# Patient Record
Sex: Male | Born: 1985 | Race: Black or African American | Hispanic: No | Smoking: Never smoker
Health system: Southern US, Community
[De-identification: ages and names within clinical notes are randomized; demographics above are authoritative.]

## PROBLEM LIST (undated history)

## (undated) DIAGNOSIS — R569 Unspecified convulsions: Secondary | ICD-10-CM

## (undated) DIAGNOSIS — U071 COVID-19: Secondary | ICD-10-CM

## (undated) DIAGNOSIS — G43909 Migraine, unspecified, not intractable, without status migrainosus: Secondary | ICD-10-CM

## (undated) HISTORY — PX: ABDOMINAL SURGERY: SHX537

---

## 2013-06-09 ENCOUNTER — Emergency Department (HOSPITAL_COMMUNITY)
Admission: EM | Admit: 2013-06-09 | Discharge: 2013-06-09 | Discharge status: Against Medical Advice | Payer: Medicare Other | Attending: Emergency Medicine | Admitting: Emergency Medicine

## 2013-06-09 ENCOUNTER — Encounter (HOSPITAL_COMMUNITY): Payer: Self-pay | Admitting: Emergency Medicine

## 2013-06-09 DIAGNOSIS — R5381 Other malaise: Secondary | ICD-10-CM | POA: Insufficient documentation

## 2013-06-09 DIAGNOSIS — R5383 Other fatigue: Principal | ICD-10-CM

## 2013-06-09 DIAGNOSIS — R51 Headache: Secondary | ICD-10-CM | POA: Insufficient documentation

## 2013-06-09 HISTORY — DX: Unspecified convulsions: R56.9

## 2013-06-09 NOTE — ED Notes (Addendum)
No answer unable to find

## 2013-06-09 NOTE — ED Notes (Addendum)
Per PTAR: Pt walking outside when he started to feel faint and hot. Pt denies eating or drinking anything (states I don't like water). PT also reports HA. Neuro intact. AO x4. Ambulatory to triage. NAD. 145/100. 88 reg. 20 RR. 96% RA. CBG 75.

## 2013-06-09 NOTE — ED Notes (Signed)
No answer, unable to find. 

## 2013-11-18 ENCOUNTER — Encounter (HOSPITAL_COMMUNITY): Payer: Self-pay | Admitting: Emergency Medicine

## 2013-11-18 ENCOUNTER — Emergency Department (HOSPITAL_COMMUNITY)
Admission: EM | Admit: 2013-11-18 | Discharge: 2013-11-18 | Disposition: A | Discharge status: Home / Self Care | Payer: Medicare Other | Attending: Emergency Medicine | Admitting: Emergency Medicine

## 2013-11-18 ENCOUNTER — Emergency Department (HOSPITAL_COMMUNITY): Payer: Medicare Other

## 2013-11-18 DIAGNOSIS — R51 Headache: Secondary | ICD-10-CM | POA: Diagnosis not present

## 2013-11-18 DIAGNOSIS — R519 Headache, unspecified: Secondary | ICD-10-CM

## 2013-11-18 MED ORDER — DIPHENHYDRAMINE HCL 50 MG/ML IJ SOLN
25.0000 mg | Freq: Once | INTRAMUSCULAR | Status: AC
  Administered 2013-11-18: 25 mg via INTRAVENOUS
  Filled 2013-11-18: qty 1

## 2013-11-18 MED ORDER — KETOROLAC TROMETHAMINE 30 MG/ML IJ SOLN
30.0000 mg | Freq: Once | INTRAMUSCULAR | Status: AC
  Administered 2013-11-18: 30 mg via INTRAVENOUS
  Filled 2013-11-18: qty 1

## 2013-11-18 MED ORDER — SODIUM CHLORIDE 0.9 % IV BOLUS (SEPSIS)
1000.0000 mL | Freq: Once | INTRAVENOUS | Status: AC
  Administered 2013-11-18: 1000 mL via INTRAVENOUS

## 2013-11-18 MED ORDER — METOCLOPRAMIDE HCL 5 MG/ML IJ SOLN
10.0000 mg | Freq: Once | INTRAMUSCULAR | Status: AC
  Administered 2013-11-18: 10 mg via INTRAVENOUS
  Filled 2013-11-18: qty 2

## 2013-11-18 NOTE — Discharge Instructions (Signed)
Refer to attached documents for more information. Return to the ED with worsening or concerning symptoms.  °

## 2013-11-18 NOTE — ED Notes (Signed)
Patient transported to CT 

## 2013-11-18 NOTE — ED Notes (Signed)
Pt arrived via EMS with c/o of awaken yesterday with headache and rt eye socket pain and took some Tylenol PM at 1700 and fell asleep. Then awaken this morning (0350) with same problem and little dizzinesss. Denies N/V or visual disturbances.

## 2013-11-18 NOTE — ED Provider Notes (Signed)
Medical screening examination/treatment/procedure(s) were performed by non-physician practitioner and as supervising physician I was immediately available for consultation/collaboration.   Billie Intriago, MD 11/18/13 0710 

## 2013-11-18 NOTE — ED Provider Notes (Signed)
CSN: [id number]     Arrival date & time 11/18/13  [id number] History   First MD Initiated Contact with Patient 11/18/13 0455     Chief Complaint  Patient presents with  . Headache     (Consider location/radiation/quality/duration/timing/severity/associated sxs/prior Treatment) HPI Comments: Patient is a 28 year old male with a past medical history of chronic migraines who presents with a headache for 2 days. Patient reports a gradual onset and progressive worsening of the headache. The pain is sharp, constant and is located in generalized head without radiation. Patient has tried tylenol for symptoms without relief. No alleviating/aggravating factors. Patient reports associated nausea. Patient denies fever, vomiting, diarrhea, numbness/tingling, weakness, visual changes, congestion, chest pain, SOB, abdominal pain.      Past Medical History  Diagnosis Date  . Seizures     pt last seizure appx 14 years ago   History reviewed. No pertinent past surgical history. Family History  Problem Relation Age of Onset  . Seizures Father    History  Substance Use Topics  . Smoking status: Never Smoker   . Smokeless tobacco: Not on file  . Alcohol Use: No    Review of Systems  Constitutional: Negative for fever, chills and fatigue.  HENT: Negative for trouble swallowing.   Eyes: Negative for visual disturbance.  Respiratory: Negative for shortness of breath.   Cardiovascular: Negative for chest pain and palpitations.  Gastrointestinal: Negative for nausea, vomiting, abdominal pain and diarrhea.  Genitourinary: Negative for dysuria and difficulty urinating.  Musculoskeletal: Negative for arthralgias and neck pain.  Skin: Negative for color change.  Neurological: Positive for headaches. Negative for dizziness and weakness.  Psychiatric/Behavioral: Negative for dysphoric mood.      Allergies  Review of patient's allergies indicates no known allergies.  Home Medications   Prior to  Admission medications   Medication Sig Start Date End Date Taking? Authorizing Provider  acetaminophen (TYLENOL) 500 MG tablet Take 1,000 mg by mouth every 6 (six) hours as needed for headache.   Yes Historical Provider, MD   BP 142/88  Pulse 87  Temp(Src) 99.1 F (37.3 C) (Oral)  Resp 18  Ht 5\' 7"  (1.702 m)  Wt 213 lb (96.616 kg)  BMI 33.35 kg/m2  SpO2 97% Physical Exam  Nursing note and vitals reviewed. Constitutional: He is oriented to person, place, and time. He appears well-developed and well-nourished. No distress.  HENT:  Head: Normocephalic and atraumatic.  Eyes: Conjunctivae and EOM are normal.  Neck: Normal range of motion.  Cardiovascular: Normal rate and regular rhythm.  Exam reveals no gallop and no friction rub.   No murmur heard. Pulmonary/Chest: Effort normal and breath sounds normal. He has no wheezes. He has no rales. He exhibits no tenderness.  Abdominal: Soft. He exhibits no distension. There is no tenderness. There is no rebound.  Musculoskeletal: Normal range of motion.  Neurological: He is alert and oriented to person, place, and time. No cranial nerve deficit. Coordination normal.  Speech is goal-oriented. Moves limbs without ataxia.   Skin: Skin is warm and dry.  Psychiatric: He has a normal mood and affect. His behavior is normal.    ED Course  Procedures (including critical care time) Labs Review Labs Reviewed - No data to display  Imaging Review Ct Head Wo Contrast  11/18/2013   CLINICAL DATA:  Initial evaluation for acute headache.  EXAM: CT HEAD WITHOUT CONTRAST  TECHNIQUE: Contiguous axial images were obtained from the base of the skull through the vertex without intravenous  contrast.  COMPARISON:  None.  FINDINGS: There is no acute intracranial hemorrhage or infarct. No mass lesion or midline shift. Gray-white matter differentiation is well maintained. Ventricles are normal in size without evidence of hydrocephalus. CSF containing spaces are  within normal limits. No extra-axial fluid collection.  The calvarium is intact.  Orbital soft tissues are within normal limits.  The paranasal sinuses and mastoid air cells are well pneumatized and free of fluid.  Scalp soft tissues are unremarkable.  IMPRESSION: Normal head CT with no acute intracranial abnormality identified.   Electronically Signed   By: Rise MuBenjamin  McClintock M.D.   On: 11/18/2013 06:32     EKG Interpretation None      MDM   Final diagnoses:  Headache    5:20 AM Patient will have CT head per request. Patient will have fluids, toradol, reglan, and benadryl. Vitals stable and patient afebrile. No neuro deficits noted.   6:41 AM Head CT unremarkable for acute changes. Patient reports improvement of symptoms and will be discharged. Vitals stable and patient afebrile.   Emilia BeckKaitlyn Ovella [place], [place] JerseyPA-C 11/18/13 [phone]

## 2014-08-12 ENCOUNTER — Emergency Department (HOSPITAL_COMMUNITY)
Admission: EM | Admit: 2014-08-12 | Discharge: 2014-08-12 | Disposition: A | Discharge status: Home / Self Care | Payer: Self-pay | Attending: Emergency Medicine | Admitting: Emergency Medicine

## 2014-08-12 ENCOUNTER — Encounter (HOSPITAL_COMMUNITY): Payer: Self-pay

## 2014-08-12 DIAGNOSIS — R05 Cough: Secondary | ICD-10-CM | POA: Insufficient documentation

## 2014-08-12 DIAGNOSIS — R0981 Nasal congestion: Secondary | ICD-10-CM | POA: Insufficient documentation

## 2014-08-12 DIAGNOSIS — H109 Unspecified conjunctivitis: Secondary | ICD-10-CM | POA: Insufficient documentation

## 2014-08-12 MED ORDER — POLYMYXIN B-TRIMETHOPRIM 10000-0.1 UNIT/ML-% OP SOLN: 1.0000 [drp] | OPHTHALMIC | Status: DC

## 2014-08-12 NOTE — Discharge Instructions (Signed)
Please follow up with your primary care physician in 1-2 days. If you do not have one [facility] listed above. Please follow up with Dr. Charlotte SanesMcCuen if symptoms not improving to schedule a follow up appointment. Please use antibiotic drops as prescribed. Please read all discharge instructions and return precautions.    Conjunctivitis Conjunctivitis is commonly called "pink eye." Conjunctivitis can be caused by bacterial or viral infection, allergies, or injuries. There is usually redness of the lining of the eye, itching, discomfort, and sometimes discharge. There may be deposits of matter along the eyelids. A viral infection usually causes a watery discharge, while a bacterial infection causes a yellowish, thick discharge. Pink eye is very contagious and spreads by direct contact. You may be given antibiotic eyedrops as part of your treatment. Before using your eye medicine, remove all drainage from the eye by washing gently with warm water and cotton balls. Continue to use the medication until you have awakened 2 mornings in a row without discharge from the eye. Do not rub your eye. This increases the irritation and helps spread infection. Use separate towels from other household members. Wash your hands with soap and water before and after touching your eyes. Use cold compresses to reduce pain and sunglasses to relieve irritation from light. Do not wear contact lenses or wear eye makeup until the infection is gone. SEEK MEDICAL CARE IF:   Your symptoms are not better after 3 days of treatment.  You have increased pain or trouble seeing.  The outer eyelids become very red or swollen. Document Released: 02/18/2004 Document Revised: 04/04/2011 Document Reviewed: 01/10/2005 [facility] Patient Information 2015 [place] [place]. This information is not intended to replace advice given to you by your health care provider. Make sure you discuss any questions you have with  your health care provider.

## 2014-08-12 NOTE — ED Notes (Signed)
Pt states his eye started itching and draining yesterday

## 2014-08-12 NOTE — ED Provider Notes (Signed)
CSN: [id number]     Arrival date & time 08/12/14  0006 History   First MD Initiated Contact with Patient 08/12/14 0032     Chief Complaint  Patient presents with  . Eye Pain     (Consider location/radiation/quality/duration/timing/severity/associated sxs/prior Treatment) HPI Comments: Patient is a 29 year old male presented to the emergency department for eye redness, itching and drainage. He states the symptoms started yesterday. He endorses it has been in both eyes. He states he's had to scrub open his eyes after sleeping.he states he has had a few days of upper respiratory infection symptoms. He is concerned this may be an allergic reaction to his cat ages Lorin Picket, denies any scratches from the cat. Denies any fevers, visual disturbances, blurred vision, photophobia, fever. 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   Past Medical History  Diagnosis Date  . Seizures     pt last seizure appx 14 years ago   History reviewed. No pertinent past surgical history. Family History  Problem Relation Age of Onset  . Seizures Father    History  Substance Use Topics  . Smoking status: Never Smoker   . Smokeless tobacco: Not on file  . Alcohol Use: No    Review of Systems  HENT: Positive for congestion and rhinorrhea.   Eyes: Positive for discharge, redness and itching. Negative for photophobia, pain and visual disturbance.  Respiratory: Positive for cough.   All other systems reviewed and are negative.     Allergies  Review of patient's allergies indicates no known allergies.  Home Medications   Prior to Admission medications   Medication Sig Start Date End Date Taking? Authorizing Provider  acetaminophen (TYLENOL) 500 MG tablet Take 1,000 mg by mouth every 6 (six) hours as needed for headache.    Historical Provider, MD  trimethoprim-polymyxin b (POLYTRIM) ophthalmic solution Place 1 drop into both eyes every 4 (four) hours. While awake x 5 days 08/12/14   Victorino Dike Cordarryl Monrreal, PA-C    BP 143/91 mmHg  Pulse 71  Temp(Src) 97.9 F (36.6 C) (Oral)  Resp 20  SpO2 95% Physical Exam  Constitutional: He is oriented to person, place, and time. He appears well-developed and well-nourished. No distress.  HENT:  Head: Normocephalic and atraumatic.  Right Ear: External ear normal.  Left Ear: External ear normal.  Nose: Nose normal.  Mouth/Throat: Oropharynx is clear and moist.  Eyes: Right eye exhibits discharge. Left eye exhibits discharge.  Visual Acuity - Bilateral Near: 20 ; Bilateral Distance: 20 ; R Near: 20 ; R Distance: 25 ; L Near: 20 ; L Distance: 20  No periorbital or orbital swelling, edema, erythema or tenderness. Dry purulent drainage noted from both eyes. Conjunctival erythema noted. Pupils are equal round reactive to light. Extraocular motions are intact.  Neck: Normal range of motion. Neck supple.  No nuchal rigidity.   Cardiovascular: Normal rate, regular rhythm and normal heart sounds.   Pulmonary/Chest: Effort normal and breath sounds normal.  Abdominal: Soft.  Musculoskeletal: Normal range of motion.  Neurological: He is alert and oriented to person, place, and time.  Skin: Skin is warm and dry. He is not diaphoretic.  Psychiatric: He has a normal mood and affect.  Nursing note and vitals reviewed.   ED Course  Procedures (including critical care time) Medications - No data to display  Labs Review Labs Reviewed - No data to display  Imaging Review No results found.   EKG Interpretation None      MDM   Final diagnoses:  Bilateral conjunctivitis    Filed Vitals:   08/12/14 0031  BP: 143/91  Pulse: 71  Temp: 97.9 F (36.6 C)  Resp: 20   Afebrile, NAD, non-toxic appearing, AAOx4.   Patient presentation consistent with bacterial conjunctivitis. Conjunctival injection with purulent discharge. No entrapment, consensual photophobia.  Presentation non-concerning for iritis, corneal abrasions, or HSV.  Will prescribe Polytrim drops.  Patient is not a contact lens wearer.  Personal hygiene and frequent handwashing discussed.  Patient advised to followup with PCP if symptoms persist or worsen in any way including vision change or purulent discharge.  Patient/parent/guardian verbalizes understanding and is agreeable with discharge.     Francee PiccoloJennifer Bernabe Dorce, PA-C 08/12/14 0208  Francee PiccoloJennifer Glenda Spelman, PA-C 08/12/14 [id number]  Marisa Severinlga Otter, MD 08/12/14 [phone]

## 2014-10-13 ENCOUNTER — Encounter (HOSPITAL_COMMUNITY): Payer: Self-pay | Admitting: Emergency Medicine

## 2014-10-13 ENCOUNTER — Emergency Department (HOSPITAL_COMMUNITY)
Admission: EM | Admit: 2014-10-13 | Discharge: 2014-10-13 | Disposition: A | Discharge status: Home / Self Care | Payer: Self-pay | Attending: Emergency Medicine | Admitting: Emergency Medicine

## 2014-10-13 ENCOUNTER — Emergency Department (HOSPITAL_COMMUNITY): Payer: Self-pay

## 2014-10-13 DIAGNOSIS — J069 Acute upper respiratory infection, unspecified: Secondary | ICD-10-CM | POA: Insufficient documentation

## 2014-10-13 MED ORDER — CETIRIZINE-PSEUDOEPHEDRINE ER 5-120 MG PO TB12: 1.0000 | ORAL_TABLET | Freq: Two times a day (BID) | ORAL | Status: DC

## 2014-10-13 MED ORDER — DEXTROMETHORPHAN POLISTIREX ER 30 MG/5ML PO SUER: 30.0000 mg | ORAL | Status: DC | PRN

## 2014-10-13 NOTE — Discharge Instructions (Signed)
Take zyrtec D for congestion. Take delsym for cough. Refer to attached documents for more information.

## 2014-10-13 NOTE — ED Notes (Signed)
Pt from home c/o cough,headache, and congestion x 9 days.

## 2014-10-13 NOTE — ED Provider Notes (Signed)
CSN: [id number]     Arrival date & time 10/13/14  0203 History   First MD Initiated Contact with Patient 10/13/14 0248     Chief Complaint  Patient presents with  . URI     (Consider location/radiation/quality/duration/timing/severity/associated sxs/prior Treatment) Patient is a 29 y.o. male presenting with URI. The history is provided by the patient. No language interpreter was used.  URI Presenting symptoms: congestion and cough   Presenting symptoms: no fatigue and no fever   Severity:  Moderate Onset quality:  Gradual Duration:  1 week Timing:  Constant Progression:  Unchanged Chronicity:  New Relieved by:  Nothing Worsened by:  Nothing tried Ineffective treatments:  None tried Associated symptoms: no arthralgias and no neck pain   Risk factors: not elderly, no chronic cardiac disease, no chronic respiratory disease, no diabetes mellitus, no immunosuppression, no recent illness, no recent travel and no sick contacts     Past Medical History  Diagnosis Date  . Seizures     pt last seizure appx 14 years ago   History reviewed. No pertinent past surgical history. Family History  Problem Relation Age of Onset  . Seizures Father    Social History  Substance Use Topics  . Smoking status: Never Smoker   . Smokeless tobacco: None  . Alcohol Use: No    Review of Systems  Constitutional: Negative for fever, chills and fatigue.  HENT: Positive for congestion. Negative for trouble swallowing.   Eyes: Negative for visual disturbance.  Respiratory: Positive for cough. Negative for shortness of breath.   Cardiovascular: Negative for chest pain and palpitations.  Gastrointestinal: Negative for nausea, vomiting, abdominal pain and diarrhea.  Genitourinary: Negative for dysuria and difficulty urinating.  Musculoskeletal: Negative for arthralgias and neck pain.  Skin: Negative for color change.  Neurological: Negative for dizziness and weakness.  Psychiatric/Behavioral:  Negative for dysphoric mood.  All other systems reviewed and are negative.     Allergies  Review of patient's allergies indicates no known allergies.  Home Medications   Prior to Admission medications   Medication Sig Start Date End Date Taking? Authorizing Provider  acetaminophen (TYLENOL) 500 MG tablet Take 1,000 mg by mouth every 6 (six) hours as needed for headache.    Historical Provider, MD  trimethoprim-polymyxin b (POLYTRIM) ophthalmic solution Place 1 drop into both eyes every 4 (four) hours. While awake x 5 days Patient not taking: Reported on 10/13/2014 08/12/14   Victorino Dike Piepenbrink, PA-C   BP 142/99 mmHg  Pulse 66  Temp(Src) 98 F (36.7 C) (Oral)  Resp 20  SpO2 97% Physical Exam  Constitutional: He is oriented to person, place, and time. He appears well-developed and well-nourished. No distress.  HENT:  Head: Normocephalic and atraumatic.  Eyes: Conjunctivae are normal.  Neck: Normal range of motion. Neck supple.  Cardiovascular: Normal rate and regular rhythm.  Exam reveals no gallop and no friction rub.   No murmur heard. Pulmonary/Chest: Effort normal and breath sounds normal. He has no wheezes. He has no rales. He exhibits no tenderness.  Abdominal: Soft. He exhibits no distension. There is no tenderness. There is no rebound.  Musculoskeletal: Normal range of motion.  Neurological: He is alert and oriented to person, place, and time. Coordination normal.  Speech is goal-oriented. Moves limbs without ataxia.   Skin: Skin is warm and dry.  Psychiatric: He has a normal mood and affect. His behavior is normal.  Nursing note and vitals reviewed.   ED Course  Procedures (including critical care  time) Labs Review Labs Reviewed - No data to display  Imaging Review Dg Chest 2 View  10/13/2014   CLINICAL DATA:  Cough, headache and congestion for 9 days  EXAM: CHEST  2 VIEW  COMPARISON:  None  FINDINGS: Normal heart size, mediastinal contours, and pulmonary  vascularity.  Minimal peribronchial thickening.  No pulmonary infiltrate, pleural effusion, or pneumothorax.  Bones unremarkable.  IMPRESSION: Minimal bronchitic changes without infiltrate.   Electronically Signed   By: Ulyses Southward M.D.   On: 10/13/2014 03:03   I have personally reviewed and evaluated these images and lab results as part of my medical decision-making.   EKG Interpretation None      MDM   Final diagnoses:  URI (upper respiratory infection)    3:58 AM Chest xray shows mild bronchitic changes. Vitals stable and patient afebrile. Patient has a viral illness and will be treated symptomatically.    Emilia Beck, PA-C 10/13/14 0450  Paula Libra, MD 10/13/14 [phone]

## 2015-05-19 ENCOUNTER — Encounter (HOSPITAL_COMMUNITY): Payer: Self-pay | Admitting: *Deleted

## 2015-05-19 ENCOUNTER — Emergency Department (HOSPITAL_COMMUNITY)
Admission: EM | Admit: 2015-05-19 | Discharge: 2015-05-19 | Disposition: A | Discharge status: Home / Self Care | Payer: Medicare Other | Attending: Emergency Medicine | Admitting: Emergency Medicine

## 2015-05-19 DIAGNOSIS — R51 Headache: Secondary | ICD-10-CM | POA: Diagnosis present

## 2015-05-19 DIAGNOSIS — G43809 Other migraine, not intractable, without status migrainosus: Secondary | ICD-10-CM

## 2015-05-19 HISTORY — DX: Migraine, unspecified, not intractable, without status migrainosus: G43.909

## 2015-05-19 MED ORDER — SODIUM CHLORIDE 0.9 % IV BOLUS (SEPSIS)
1000.0000 mL | Freq: Once | INTRAVENOUS | Status: AC
  Administered 2015-05-19: 1000 mL via INTRAVENOUS

## 2015-05-19 MED ORDER — DIPHENHYDRAMINE HCL 50 MG/ML IJ SOLN
25.0000 mg | Freq: Once | INTRAMUSCULAR | Status: AC
  Administered 2015-05-19: 25 mg via INTRAVENOUS
  Filled 2015-05-19: qty 1

## 2015-05-19 MED ORDER — DEXAMETHASONE SODIUM PHOSPHATE 10 MG/ML IJ SOLN
10.0000 mg | Freq: Once | INTRAMUSCULAR | Status: AC
  Administered 2015-05-19: 10 mg via INTRAVENOUS
  Filled 2015-05-19: qty 1

## 2015-05-19 MED ORDER — PROCHLORPERAZINE EDISYLATE 5 MG/ML IJ SOLN
10.0000 mg | Freq: Once | INTRAMUSCULAR | Status: AC
  Administered 2015-05-19: 10 mg via INTRAVENOUS
  Filled 2015-05-19: qty 2

## 2015-05-19 NOTE — ED Notes (Signed)
Pt here with c/o headache x 1 hour.

## 2015-05-19 NOTE — ED Notes (Signed)
Pt here with c/o headache x 1 hour.  Pt states he did not take anything for it.  Denies any other complications.

## 2015-05-19 NOTE — ED Provider Notes (Signed)
CSN: [id number]     Arrival date & time 05/19/15  [id number] History   First MD Initiated Contact with Patient 05/19/15 [phone]     Chief Complaint  Patient presents with  . Headache     (Consider location/radiation/quality/duration/timing/severity/associated sxs/prior Treatment) HPI Comments: Migraines all life This headache started around 8AM Feels like someone "hitting me in the head with a hammer" Similar to past migraines 5 [place] but didn't help, not sure what it was, did not continue using it because it didn't help Did not try anything  Bright lights, loud sounds make it worse Watching TV No n/v  Patient is a 30 y.o. male presenting with headaches.  Headache Associated symptoms: no abdominal pain, no back pain, no fever, no neck stiffness, no numbness, no sore throat and no weakness     Past Medical History  Diagnosis Date  . Seizures (HCC)     pt last seizure appx 14 years ago  . Migraine    History reviewed. No pertinent past surgical history. Family History  Problem Relation Age of Onset  . Seizures Father    Social History  Substance Use Topics  . Smoking status: Never Smoker   . Smokeless tobacco: None  . Alcohol Use: No    Review of Systems  Constitutional: Negative for fever.  HENT: Negative for sore throat.   Eyes: Positive for visual disturbance (blurred vision similar to past migraines).  Respiratory: Negative for shortness of breath.   Cardiovascular: Negative for chest pain.  Gastrointestinal: Negative for abdominal pain.  Genitourinary: Negative for difficulty urinating.  Musculoskeletal: Negative for back pain and neck stiffness.  Skin: Negative for rash.  Neurological: Positive for headaches. Negative for syncope, facial asymmetry, weakness and numbness.      Allergies  Review of patient's allergies indicates no known allergies.  Home Medications   Prior to Admission medications   Medication Sig Start Date End Date  Taking? Authorizing Provider  acetaminophen (TYLENOL) 500 MG tablet Take 1,000 mg by mouth every 6 (six) hours as needed for headache.   Yes Historical Provider, MD  cetirizine (ZYRTEC) 10 MG tablet Take 10 mg by mouth daily as needed for allergies.   Yes Historical Provider, MD  Phenyleph-CPM-DM-APAP (TYLENOL COLD HEAD CONGESTION PO) Take 1 tablet by mouth daily as needed (headache).   Yes Historical Provider, MD   BP 147/106 mmHg  Pulse 70  Temp(Src) 97.6 F (36.4 C) (Oral)  Resp 20  SpO2 95% Physical Exam  Constitutional: He is oriented to person, place, and time. He appears well-developed and well-nourished. No distress.  HENT:  Head: Normocephalic and atraumatic.  Eyes: Conjunctivae and EOM are normal.  Neck: Normal range of motion.  Cardiovascular: Normal rate, regular rhythm, normal heart sounds and intact distal pulses.  Exam reveals no gallop and no friction rub.   No murmur heard. Pulmonary/Chest: Effort normal and breath sounds normal. No respiratory distress. He has no wheezes. He has no rales.  Abdominal: Soft. He exhibits no distension. There is no tenderness. There is no guarding.  Musculoskeletal: He exhibits no edema.  Neurological: He is alert and oriented to person, place, and time. He has normal strength. No cranial nerve deficit or sensory deficit. He displays a negative Romberg sign. Coordination and gait normal. GCS eye subscore is 4. GCS verbal subscore is 5. GCS motor subscore is 6.  Skin: Skin is warm and dry. He is not diaphoretic.  Nursing note and vitals reviewed.   ED  Course  Procedures (including critical care time) Labs Review Labs Reviewed - No data to display  Imaging Review No results found. I have personally reviewed and evaluated these images and lab results as part of my medical decision-making.   EKG Interpretation None      MDM   Final diagnoses:  Other migraine without status migrainosus, not intractable   30yo male with history  of migraines presents with concern of headache.  Headache began slowly, no trauma, no fevers, and normal neurologic exam and have low suspicion for [facility], SDH or meningitis.  Patient was given compazine, decadron and benadryl with improvement in headache.  Patient discharged in stable condition with understanding of reasons to return.  Pt requesting MRI and reports that Neurologist in [place] had discussed doing a repeat (pt with headaches for most of his life and prior imaging.  Given information for PCP follow up and Neurology follow up for migraines.   Alvira Monday, MD 05/19/15 2155

## 2015-05-19 NOTE — Discharge Instructions (Signed)

## 2015-05-26 ENCOUNTER — Ambulatory Visit (INDEPENDENT_AMBULATORY_CARE_PROVIDER_SITE_OTHER): Payer: Medicare Other | Admitting: Family Medicine

## 2015-05-26 ENCOUNTER — Encounter: Payer: Self-pay | Admitting: Family Medicine

## 2015-05-26 VITALS — BP 121/77 | HR 60 | Temp 98.1°F | Resp 18 | Ht 64.0 in | Wt 231.0 lb

## 2015-05-26 DIAGNOSIS — Z23 Encounter for immunization: Secondary | ICD-10-CM | POA: Diagnosis not present

## 2015-05-26 DIAGNOSIS — E669 Obesity, unspecified: Secondary | ICD-10-CM | POA: Diagnosis not present

## 2015-05-26 DIAGNOSIS — IMO0001 Reserved for inherently not codable concepts without codable children: Secondary | ICD-10-CM

## 2015-05-26 DIAGNOSIS — Z114 Encounter for screening for human immunodeficiency virus [HIV]: Secondary | ICD-10-CM | POA: Diagnosis not present

## 2015-05-26 DIAGNOSIS — R030 Elevated blood-pressure reading, without diagnosis of hypertension: Secondary | ICD-10-CM | POA: Diagnosis not present

## 2015-05-26 LAB — COMPLETE METABOLIC PANEL WITH GFR
ALT: 123 U/L — ABNORMAL HIGH (ref 9–46)
AST: 71 U/L — ABNORMAL HIGH (ref 10–40)
Albumin: 4.1 g/dL (ref 3.6–5.1)
Alkaline Phosphatase: 92 U/L (ref 40–115)
BILIRUBIN TOTAL: 0.3 mg/dL (ref 0.2–1.2)
BUN: 13 mg/dL (ref 7–25)
CO2: 23 mmol/L (ref 20–31)
Calcium: 9.2 mg/dL (ref 8.6–10.3)
Chloride: 107 mmol/L (ref 98–110)
Creat: 0.83 mg/dL (ref 0.60–1.35)
GFR, Est African American: >89 mL/min (ref >=60)
GLUCOSE: 111 mg/dL — AB (ref 65–99)
Potassium: 4.1 mmol/L (ref 3.5–5.3)
SODIUM: 138 mmol/L (ref 135–146)
TOTAL PROTEIN: 6.8 g/dL (ref 6.1–8.1)

## 2015-05-26 LAB — CBC WITH DIFFERENTIAL/PLATELET
BASOS ABS: 0 {cells}/uL (ref 0–200)
Basophils Relative: 0 %
Eosinophils Absolute: 154 cells/uL (ref 15–500)
Eosinophils Relative: 2 %
HCT: 40.6 % (ref 38.5–50.0)
HEMOGLOBIN: 12.9 g/dL — AB (ref 13.2–17.1)
LYMPHS ABS: 2079 {cells}/uL (ref 850–3900)
Lymphocytes Relative: 27 %
MCH: 25.6 pg — ABNORMAL LOW (ref 27.0–33.0)
MCHC: 31.8 g/dL — ABNORMAL LOW (ref 32.0–36.0)
MCV: 80.6 fL (ref 80.0–100.0)
MONO ABS: 693 {cells}/uL (ref 200–950)
MONOS PCT: 9 %
MPV: 10.7 fL (ref 7.5–12.5)
NEUTROS ABS: 4774 {cells}/uL (ref 1500–7800)
Neutrophils Relative %: 62 %
Platelets: 250 10*3/uL (ref 140–400)
RBC: 5.04 MIL/uL (ref 4.20–5.80)
RDW: 15.2 % — ABNORMAL HIGH (ref 11.0–15.0)
WBC: 7.7 10*3/uL (ref 3.8–10.8)

## 2015-05-26 LAB — LIPID PANEL
CHOL/HDL RATIO: 2.4 ratio (ref <=5.0)
Cholesterol: 105 mg/dL — ABNORMAL LOW (ref 125–200)
HDL: 44 mg/dL (ref >=40)
LDL Cholesterol: 47 mg/dL (ref <130)
Triglycerides: 71 mg/dL (ref <150)
VLDL: 14 mg/dL (ref <30)

## 2015-05-26 LAB — TSH: TSH: 1.53 m[IU]/L (ref 0.40–4.50)

## 2015-05-26 NOTE — Patient Instructions (Signed)

## 2015-05-26 NOTE — Progress Notes (Signed)
Patient is here to establish care  Patient denies pain at this time.   

## 2015-05-26 NOTE — Progress Notes (Signed)
Patient ID: David Stevenson, male   DOB: 02/18/1985, 30 y.o.   MRN: [id number]   David BeringChristopher Stevenson, is a 30 y.o. male  [id number]  [id number]  DOB - 07/31/1985  CC:  Chief Complaint  Patient presents with  . Establish Care       HPI: David Stevenson is a 30 y.o. male here to establish care. He reports being generally healthly. He has a history of migraine headaches but has recently had an acupuncture procedure which he reports has helped significantly. He only other concern is of a 40 pound weight gain. Over last year or two. He is on not chronic medications.   Health Maintenance:  He denies tobacco, alcohol, or drug use. His Tdap is up to date. He has never been screened for HIV.  No Known Allergies Past Medical History  Diagnosis Date  . Seizures (HCC)     pt last seizure appx 14 years ago  . Migraine    Current Outpatient Prescriptions on File Prior to Visit  Medication Sig Dispense Refill  . acetaminophen (TYLENOL) 500 MG tablet Take 1,000 mg by mouth every 6 (six) hours as needed for headache.    . Phenyleph-CPM-DM-APAP (TYLENOL COLD HEAD CONGESTION PO) Take 1 tablet by mouth daily as needed (headache).    . cetirizine (ZYRTEC) 10 MG tablet Take 10 mg by mouth daily as needed for allergies. Reported on 05/26/2015     No current facility-administered medications on file prior to visit.   Family History  Problem Relation Age of Onset  . Seizures Father    Social History   Social History  . Marital Status: Single    Spouse Name: N/A  . Number of Children: N/A  . Years of Education: N/A   Occupational History  . Not on file.   Social History Main Topics  . Smoking status: Never Smoker   . Smokeless tobacco: Not on file  . Alcohol Use: No  . Drug Use: No  . Sexual Activity: Yes   Other Topics Concern  . Not on file   Social History Narrative    Review of Systems: Constitutional: Negative for fever, chills, appetite change, weight loss,   Fatigue.Positive for weight gain Skin: Negative for rashes or lesions of concern. HENT: Negative for ear pain, ear discharge.nose bleeds Eyes: Negative for pain, discharge, redness, itching and visual disturbance. Neck: Negative for pain, stiffness Respiratory: Negative for cough. Positive for shortness of breath with walking since weight gain.   Cardiovascular: Negative for chest pain, palpitations and leg swelling. Gastrointestinal: Negative for abdominal pain, nausea, vomiting, diarrhea, constipations Genitourinary: Negative for dysuria, urgency, frequency, hematuria,  Musculoskeletal: Negative for back pain, joint pain, joint  swelling, and gait problem.Negative for weakness. Neurological: Negative for dizziness, tremors, fainting. Has a history of a few seizures as a child. Positive for headaches as discussed in HPI Psychiatric/Behavioral: Negative for depression, anxiety, decreased concentration, confusion   Objective:   Filed Vitals:   05/26/15 0950  BP: 121/77  Pulse: 60  Temp: 98.1 F (36.7 C)  Resp: 18    Physical Exam: Constitutional: Patient appears well-developed and well-nourished. No distress.Obese HENT: Normocephalic, atraumatic, External right and left ear normal. Oropharynx is clear and moist.  Eyes: Conjunctivae and EOM are normal. PERRLA, no scleral icterus. Neck: Normal ROM. Neck supple. No lymphadenopathy, No thyromegaly. CVS: RRR, S1/S2 +, no murmurs, no gallops, no rubs Pulmonary: Effort and breath sounds normal, no stridor, rhonchi, wheezes, rales.  Abdominal: Soft. Normoactive BS,, no distension, tenderness,  rebound or guarding.  Musculoskeletal: Normal range of motion. No edema and no tenderness.  Neuro: Alert.Normal muscle tone coordination. Non-focal Skin: Skin is warm and dry. No rash noted. Not diaphoretic. No erythema. No pallor. Psychiatric: Normal mood and affect. Behavior, judgment, thought content normal.  No results found for: WBC, HGB, HCT,  MCV, PLT No results found for: CREATININE, BUN, NA, K, CL, CO2  No results found for: HGBA1C Lipid Panel  No results found for: CHOL, TRIG, HDL, CHOLHDL, VLDL, LDLCALC     Assessment and plan:   1. Obesity - Lipid panel - TSH - Printed information on wt loss with calorie control and exercise provided.  2. Elevated blood pressure - COMPLETE METABOLIC PANEL WITH GFR - CBC with Differential  3. Need for Tdap vaccination - Tdap vaccine greater than or equal to 7yo IM  4. Screening for HIV (human immunodeficiency virus) - HIV antibody (with reflex)   Return in about 4 weeks (around 06/23/2015). For BP check by nurse.  The patient was given clear instructions to go to ER or return to medical center if symptoms don't improve, worsen or new problems develop. The patient verbalized understanding.    Henrietta Hoover FNP  05/26/2015, 12:23 PM

## 2015-05-27 LAB — HIV ANTIBODY (ROUTINE TESTING W REFLEX): HIV: NONREACTIVE

## 2015-06-23 ENCOUNTER — Ambulatory Visit: Payer: Medicare Other

## 2015-10-27 ENCOUNTER — Emergency Department (HOSPITAL_COMMUNITY)
Admission: EM | Admit: 2015-10-27 | Discharge: 2015-10-27 | Disposition: A | Discharge status: Home / Self Care | Payer: Medicaid Other | Attending: Emergency Medicine | Admitting: Emergency Medicine

## 2015-10-27 ENCOUNTER — Encounter (HOSPITAL_COMMUNITY): Payer: Self-pay | Admitting: Emergency Medicine

## 2015-10-27 ENCOUNTER — Emergency Department (HOSPITAL_COMMUNITY): Payer: Medicaid Other

## 2015-10-27 DIAGNOSIS — J069 Acute upper respiratory infection, unspecified: Secondary | ICD-10-CM

## 2015-10-27 DIAGNOSIS — R05 Cough: Secondary | ICD-10-CM

## 2015-10-27 DIAGNOSIS — R059 Cough, unspecified: Secondary | ICD-10-CM

## 2015-10-27 DIAGNOSIS — Z79899 Other long term (current) drug therapy: Secondary | ICD-10-CM | POA: Diagnosis not present

## 2015-10-27 MED ORDER — GUAIFENESIN 100 MG/5ML PO SOLN
10.0000 mL | Freq: Once | ORAL | Status: AC
  Administered 2015-10-27: 200 mg via ORAL
  Filled 2015-10-27: qty 10

## 2015-10-27 MED ORDER — IBUPROFEN 200 MG PO TABS
400.0000 mg | ORAL_TABLET | Freq: Once | ORAL | Status: AC
  Administered 2015-10-27: 400 mg via ORAL
  Filled 2015-10-27: qty 2

## 2015-10-27 NOTE — ED Notes (Signed)
Bed: WA08 Expected date:  Expected time:  Means of arrival:  Comments: EMS-LUQ pain, Flu symptoms x week

## 2015-10-27 NOTE — ED Provider Notes (Signed)
[facility]-EMERGENCY DEPT Provider Note   CSN: [id number] Arrival date & time: 10/27/15  0831     History   Chief Complaint Chief Complaint  Patient presents with  . Abdominal Pain    HPI Laurena BeringChristopher Mcclory is a 30 y.o. male.  Patient c/o non prod cough for past 1 week. Episodic, persistent. Mild sob. No sore throat. +nasal congestion. No known ill contacts. ?subjective fever. No chest pain, except when sneezes hard, and then states sharp, brief pain. No leg pain or swelling. Normal appetite. No abd pain. No nv. No headache. No neck pain or stiffness.    The history is provided by the patient.  Abdominal Pain   Pertinent negatives include vomiting and headaches.    Past Medical History:  Diagnosis Date  . Migraine   . Seizures (HCC)    pt last seizure appx 14 years ago    There are no active problems to display for this patient.   History reviewed. No pertinent surgical history.     Home Medications    Prior to Admission medications   Medication Sig Start Date End Date Taking? Authorizing Provider  acetaminophen (TYLENOL) 500 MG tablet Take 1,000 mg by mouth every 6 (six) hours as needed for headache.    Historical Provider, MD  cetirizine (ZYRTEC) 10 MG tablet Take 10 mg by mouth daily as needed for allergies. Reported on 05/26/2015    Historical Provider, MD  Phenyleph-CPM-DM-APAP (TYLENOL COLD HEAD CONGESTION PO) Take 1 tablet by mouth daily as needed (headache).    Historical Provider, MD    Family History Family History  Problem Relation Age of Onset  . Seizures Father     Social History Social History  Substance Use Topics  . Smoking status: Never Smoker  . Smokeless tobacco: Not on file  . Alcohol use No     Allergies   Review of patient's allergies indicates no known allergies.   Review of Systems Review of Systems  Constitutional: Negative for chills.  HENT: Positive for congestion. Negative for sore throat.   Eyes: Negative for redness.    Respiratory: Positive for cough and shortness of breath.   Cardiovascular: Negative for chest pain and leg swelling.  Gastrointestinal: Positive for abdominal pain. Negative for vomiting.  Musculoskeletal: Negative for neck pain and neck stiffness.  Skin: Negative for rash.  Neurological: Negative for headaches.     Physical Exam Updated Vital Signs There were no vitals taken for this visit.  Physical Exam  Constitutional: He is oriented to person, place, and time. He appears well-developed and well-nourished. No distress.  HENT:  Head: Atraumatic.  Mouth/Throat: Oropharynx is clear and moist.  Eyes: Conjunctivae are normal.  Neck: Neck supple. No tracheal deviation present.  Cardiovascular: Normal rate, regular rhythm, normal heart sounds and intact distal pulses.  Exam reveals no gallop and no friction rub.   No murmur heard. Pulmonary/Chest: Effort normal. No accessory muscle usage. No respiratory distress.  Upper resp congestion  Abdominal: Soft. He exhibits no distension. There is no tenderness.  Musculoskeletal: He exhibits no edema or tenderness.  Neurological: He is alert and oriented to person, place, and time.  Skin: Skin is warm and dry.  Psychiatric: He has a normal mood and affect.  Nursing note and vitals reviewed.    ED Treatments / Results  Labs (all labs ordered are listed, but only abnormal results are displayed) Labs Reviewed - No data to display  EKG  EKG Interpretation None  Radiology Dg Chest 2 View  Result Date: 10/27/2015 CLINICAL DATA:  Cough and chest congestion. Shortness of breath, 1 week duration. EXAM: CHEST  2 VIEW COMPARISON:  10/12/2014 FINDINGS: Heart size is normal. Mediastinal shadows are normal. The lungs are clear. No bronchial thickening. No infiltrate, mass, effusion or collapse. Pulmonary vascularity is normal. No bony abnormality. IMPRESSION: Normal chest Electronically Signed   By: Paulina Fusi M.D.   On: 10/27/2015  09:16    Procedures Procedures (including critical care time)  Medications Ordered in ED Medications - No data to display   Initial Impression / Assessment and Plan / ED Course  I have reviewed the triage vital signs and the nursing notes.  Pertinent labs & imaging results that were available during my care of the patient were reviewed by me and considered in my medical decision making (see chart for details).  Clinical Course    Cxr.   Robitussin. Motrin po.  cxr neg acute, patient appears stable for d/c.   Reviewed nursing notes and prior charts for additional history. .  Discussed xray w pt.    No increased wob.    Patient appears stable for d/c.    Final Clinical Impressions(s) / ED Diagnoses   Final diagnoses:  None    New Prescriptions New Prescriptions   No medications on file     Cathren Laine, MD 10/28/15 1437

## 2015-10-27 NOTE — Discharge Instructions (Signed)
It was our pleasure to provide your ER care today - we hope that you feel better.  Rest. Drink plenty of fluids.  Take mucinex or robitussin as need for cough and congestion.  Take motrin or aleve as need for pain.   Follow up with primary care doctor in 1 week if symptoms fail to improve/resolve.  Also have your blood pressure rechecked by primary care doctor as it is high today.   Return to ER if worse, trouble breathing, other concern.
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
## 2017-03-21 ENCOUNTER — Emergency Department (HOSPITAL_COMMUNITY)
Admission: EM | Admit: 2017-03-21 | Discharge: 2017-03-21 | Disposition: A | Discharge status: Home / Self Care | Payer: Medicare Other | Attending: Emergency Medicine | Admitting: Emergency Medicine

## 2017-03-21 ENCOUNTER — Encounter (HOSPITAL_COMMUNITY): Payer: Self-pay | Admitting: Emergency Medicine

## 2017-03-21 DIAGNOSIS — M62830 Muscle spasm of back: Secondary | ICD-10-CM | POA: Diagnosis not present

## 2017-03-21 DIAGNOSIS — R030 Elevated blood-pressure reading, without diagnosis of hypertension: Secondary | ICD-10-CM

## 2017-03-21 DIAGNOSIS — M545 Low back pain: Secondary | ICD-10-CM | POA: Diagnosis present

## 2017-03-21 MED ORDER — PREDNISONE 20 MG PO TABS
60.0000 mg | ORAL_TABLET | Freq: Once | ORAL | Status: AC
  Administered 2017-03-21: 60 mg via ORAL
  Filled 2017-03-21: qty 3

## 2017-03-21 MED ORDER — CYCLOBENZAPRINE HCL 10 MG PO TABS: 10.0000 mg | ORAL_TABLET | Freq: Two times a day (BID) | ORAL | 0 refills | Status: DC | PRN

## 2017-03-21 MED ORDER — PREDNISONE 10 MG PO TABS
20.0000 mg | ORAL_TABLET | Freq: Every day | ORAL | 0 refills | Status: DC
Start: 2017-03-21 — End: 2019-10-12

## 2017-03-21 MED ORDER — CYCLOBENZAPRINE HCL 10 MG PO TABS
10.0000 mg | ORAL_TABLET | Freq: Once | ORAL | Status: AC
  Administered 2017-03-21: 10 mg via ORAL
  Filled 2017-03-21: qty 1

## 2017-03-21 NOTE — ED Provider Notes (Signed)
[facility]-EMERGENCY DEPT Provider Note   CSN: [id number] Arrival date & time: 03/21/17  1356     History   Chief Complaint Chief Complaint  Patient presents with  . Back Pain    HPI David Stevenson is a 32 y.o. male who presents to the ED with right side back pain. Patient reports that he was sitting and watching TV and turned. When he turned he felt pain in his right lower back. Patient denies loss of control of bladder or bowels. Patient denies UTI symptoms.   HPI  Past Medical History:  Diagnosis Date  . Migraine   . Seizures (HCC)    pt last seizure appx 14 years ago    There are no active problems to display for this patient.   Past Surgical History:  Procedure Laterality Date  . ABDOMINAL SURGERY         Home Medications    Prior to Admission medications   Medication Sig Start Date End Date Taking? Authorizing Provider  acetaminophen (TYLENOL) 500 MG tablet Take 1,000 mg by mouth every 6 (six) hours as needed for headache.    [provider]  cetirizine (ZYRTEC) 10 MG tablet Take 10 mg by mouth daily as needed for allergies. Reported on 05/26/2015    [provider]  cyclobenzaprine (FLEXERIL) 10 MG tablet Take 1 tablet (10 mg total) by mouth 2 (two) times daily as needed for muscle spasms. 03/21/17   Janne Napoleon, NP  Phenyleph-CPM-DM-APAP (TYLENOL COLD HEAD CONGESTION PO) Take 1 tablet by mouth daily as needed (headache).    [provider]  predniSONE (DELTASONE) 10 MG tablet Take 2 tablets (20 mg total) by mouth daily. 03/21/17   Janne Napoleon, NP    Family History Family History  Problem Relation Age of Onset  . Seizures Father     Social History Social History   Tobacco Use  . Smoking status: Never Smoker  . Smokeless tobacco: Never Used  Substance Use Topics  . Alcohol use: No  . Drug use: No     Allergies   Patient has no known allergies.   Review of Systems Review of Systems    Musculoskeletal: Positive for back pain.  All other systems reviewed and are negative.    Physical Exam Updated Vital Signs BP (!) 151/110 (BP Location: Left Arm)   Pulse 66   Temp 97.8 F (36.6 C) (Oral)   Resp 18   SpO2 97%   Physical Exam  Constitutional: He appears well-developed and well-nourished. No distress.  HENT:  Head: Normocephalic and atraumatic.  Eyes: EOM are normal.  Neck: Normal range of motion. Neck supple.  Cardiovascular: Normal rate, regular rhythm and intact distal pulses.  Pulmonary/Chest: Effort normal. No respiratory distress. He has no wheezes. He has no rales.  Abdominal: Soft. Bowel sounds are normal. There is no tenderness.  Musculoskeletal: He exhibits no edema.       Lumbar back: He exhibits tenderness and spasm. He exhibits normal range of motion, no deformity and normal pulse.       Back:  Neurological: He is alert. He has normal strength and normal reflexes. No cranial nerve deficit or sensory deficit. Gait normal.  Skin: Skin is warm and dry.  Psychiatric: He has a normal mood and affect. His behavior is normal.  Nursing note and vitals reviewed.    ED Treatments / Results  Labs (all labs ordered are listed, but only abnormal results are displayed) Labs Reviewed -  No data to display Radiology No results found.  Procedures Procedures (including critical care time)  Medications Ordered in ED Medications  cyclobenzaprine (FLEXERIL) tablet 10 mg (10 mg Oral Given 03/21/17 1633)  predniSONE (DELTASONE) tablet 60 mg (60 mg Oral Given 03/21/17 1633)     Initial Impression / Assessment and Plan / ED Course  I have reviewed the triage vital signs and the nursing notes. Patient with back pain.  No neurological deficits and normal neuro exam.  Patient can walk but states is painful.  No loss of bowel or bladder control.  No concern for cauda equina.  No fever, night sweats, weight loss, h/o cancer, IVDU.  RICE protocol and pain medicine  indicated and discussed with patient.   Discussed in detail with the patient elevated BP and need for f/u. He agrees to call PCP for f/u.  Final Clinical Impressions(s) / ED Diagnoses   Final diagnoses:  Muscle spasm of back  Elevated blood pressure reading    ED Discharge Orders        Ordered    cyclobenzaprine (FLEXERIL) 10 MG tablet  2 times daily PRN     03/21/17 1642    predniSONE (DELTASONE) 10 MG tablet  Daily     03/21/17 1642       Damian Leavelleese, [place]if[place] M, [place]NP 03/21/17 1727    Alvira MondaySchlossman, Erin, MD 03/23/17 1720

## 2017-03-21 NOTE — ED Triage Notes (Signed)
Patient reports that he was sitting down and when turned to get up has severe lower back pain on right side. Denies any urinary problems.

## 2017-03-21 NOTE — Discharge Instructions (Signed)
Your blood pressure was elevated today. It is important that you follow  up with your doctor for further evaluation. Return here as needed. Do not drive while taking the muscle relaxer as it will make you sleepy.   You do not need to start the prednisone Rx until tomorrow.

## 2019-01-29 ENCOUNTER — Other Ambulatory Visit: Payer: Self-pay | Admitting: Cardiology

## 2019-01-29 DIAGNOSIS — Z20822 Contact with and (suspected) exposure to covid-19: Secondary | ICD-10-CM

## 2019-01-30 LAB — NOVEL CORONAVIRUS, NAA: SARS-CoV-2, NAA: NOT DETECTED

## 2019-10-07 ENCOUNTER — Emergency Department (HOSPITAL_COMMUNITY)
Admission: EM | Admit: 2019-10-07 | Discharge: 2019-10-07 | Disposition: A | Discharge status: Home / Self Care | Payer: Medicare Other | Attending: Emergency Medicine | Admitting: Emergency Medicine

## 2019-10-07 ENCOUNTER — Encounter (HOSPITAL_COMMUNITY): Payer: Self-pay | Admitting: Emergency Medicine

## 2019-10-07 ENCOUNTER — Emergency Department (HOSPITAL_COMMUNITY): Payer: Medicare Other

## 2019-10-07 DIAGNOSIS — U071 COVID-19: Secondary | ICD-10-CM | POA: Insufficient documentation

## 2019-10-07 DIAGNOSIS — R112 Nausea with vomiting, unspecified: Secondary | ICD-10-CM | POA: Diagnosis not present

## 2019-10-07 DIAGNOSIS — R519 Headache, unspecified: Secondary | ICD-10-CM | POA: Diagnosis present

## 2019-10-07 LAB — CBC WITH DIFFERENTIAL/PLATELET
Abs Immature Granulocytes: 0.02 10*3/uL (ref 0.00–0.07)
Basophils Absolute: 0 10*3/uL (ref 0.0–0.1)
Basophils Relative: 0 %
Eosinophils Absolute: 0 10*3/uL (ref 0.0–0.5)
Eosinophils Relative: 0 %
HCT: 45.5 % (ref 39.0–52.0)
Hemoglobin: 14 g/dL (ref 13.0–17.0)
Immature Granulocytes: 0 %
Lymphocytes Relative: 6 %
Lymphs Abs: 0.5 10*3/uL — ABNORMAL LOW (ref 0.7–4.0)
MCH: 25.3 pg — ABNORMAL LOW (ref 26.0–34.0)
MCHC: 30.8 g/dL (ref 30.0–36.0)
MCV: 82.1 fL (ref 80.0–100.0)
Monocytes Absolute: 0.4 10*3/uL (ref 0.1–1.0)
Monocytes Relative: 5 %
Neutro Abs: 6.6 10*3/uL (ref 1.7–7.7)
Neutrophils Relative %: 89 %
Platelets: 176 10*3/uL (ref 150–400)
RBC: 5.54 MIL/uL (ref 4.22–5.81)
RDW: 14.4 % (ref 11.5–15.5)
WBC: 7.5 10*3/uL (ref 4.0–10.5)
nRBC: 0 % (ref 0.0–0.2)

## 2019-10-07 LAB — COMPREHENSIVE METABOLIC PANEL
ALT: 50 U/L — ABNORMAL HIGH (ref 0–44)
AST: 41 U/L (ref 15–41)
Albumin: 3.5 g/dL (ref 3.5–5.0)
Alkaline Phosphatase: 56 U/L (ref 38–126)
Anion gap: 13 (ref 5–15)
BUN: 14 mg/dL (ref 6–20)
CO2: 20 mmol/L — ABNORMAL LOW (ref 22–32)
Calcium: 8.4 mg/dL — ABNORMAL LOW (ref 8.9–10.3)
Chloride: 105 mmol/L (ref 98–111)
Creatinine, Ser: 1.23 mg/dL (ref 0.61–1.24)
GFR calc Af Amer: >60 mL/min (ref >60)
GFR calc non Af Amer: >60 mL/min (ref >60)
Glucose, Bld: 108 mg/dL — ABNORMAL HIGH (ref 70–99)
Potassium: 3.3 mmol/L — ABNORMAL LOW (ref 3.5–5.1)
Sodium: 138 mmol/L (ref 135–145)
Total Bilirubin: 0.6 mg/dL (ref 0.3–1.2)
Total Protein: 7.6 g/dL (ref 6.5–8.1)

## 2019-10-07 LAB — LIPASE, BLOOD: Lipase: 37 U/L (ref 11–51)

## 2019-10-07 MED ORDER — ACETAMINOPHEN 500 MG PO TABS: 1000.0000 mg | ORAL_TABLET | Freq: Once | ORAL | Status: DC

## 2019-10-07 MED ORDER — METOCLOPRAMIDE HCL 5 MG/ML IJ SOLN
5.0000 mg | Freq: Once | INTRAMUSCULAR | Status: AC
  Administered 2019-10-07: 5 mg via INTRAVENOUS
  Filled 2019-10-07: qty 2

## 2019-10-07 MED ORDER — DIPHENHYDRAMINE HCL 50 MG/ML IJ SOLN
25.0000 mg | Freq: Once | INTRAMUSCULAR | Status: AC
  Administered 2019-10-07: 25 mg via INTRAVENOUS
  Filled 2019-10-07: qty 1

## 2019-10-07 MED ORDER — SODIUM CHLORIDE 0.9 % IV BOLUS
1000.0000 mL | Freq: Once | INTRAVENOUS | Status: AC
  Administered 2019-10-07: 1000 mL via INTRAVENOUS

## 2019-10-07 MED ORDER — ONDANSETRON HCL 4 MG/2ML IJ SOLN: 4.0000 mg | Freq: Once | INTRAMUSCULAR | Status: DC

## 2019-10-07 NOTE — ED Triage Notes (Signed)
Pt arrives to ED with c/o of covid + tested on Wednesday- pt arrives to ED with headache hx of migraines and not able to keep anything down.

## 2019-10-07 NOTE — ED Provider Notes (Signed)
[facility] EMERGENCY DEPARTMENT Provider Note   CSN: [id number] Arrival date & time: 10/07/19  1115     History Chief Complaint  Patient presents with  . Headache    David Stevenson is a 34 y.o. male history of migraines/seizures.  Patient presents today for chief complaint of headache.  Headache onset 7 days ago, frontal headache sharp throbbing sensation nonradiating worsened with light and chocolate improved with rest.  He reports history of migraines and that this headache is exactly similar to his previous migraines and denies any abnormal features.  He reports over the last 2 days he has developed nausea and vomiting as well which is consistent with previous migraines.  Associated upper abdominal cramping worse with vomiting improved with rest.  Additionally has developed nonbloody diarrhea over the last 2 days.  Additionally patient reports that he tested positive for Covid 5 days ago.  He reports this test was at a [facility].  The day of his positive test he had fevers, nonproductive cough and rhinorrhea.  Those symptoms have gradually resolved.  Denies fevers/chills, vision changes, neck stiffness, sore throat, chest pain/shortness of breath, extremity swelling/color change, dysuria/hematuria or any additional concerns.  HPI     Past Medical History:  Diagnosis Date  . Migraine   . Seizures (HCC)    pt last seizure appx 14 years ago    There are no problems to display for this patient.   Past Surgical History:  Procedure Laterality Date  . ABDOMINAL SURGERY         Family History  Problem Relation Age of Onset  . Seizures Father     Social History   Tobacco Use  . Smoking status: Never Smoker  . Smokeless tobacco: Never Used  Vaping Use  . Vaping Use: Never used  Substance Use Topics  . Alcohol use: No  . Drug use: No    Home Medications Prior to Admission medications   Medication Sig Start Date End Date Taking? Authorizing  Provider  acetaminophen (TYLENOL) 500 MG tablet Take 1,000 mg by mouth every 6 (six) hours as needed for headache.    [provider]  cetirizine (ZYRTEC) 10 MG tablet Take 10 mg by mouth daily as needed for allergies. Reported on 05/26/2015    [provider]  cyclobenzaprine (FLEXERIL) 10 MG tablet Take 1 tablet (10 mg total) by mouth 2 (two) times daily as needed for muscle spasms. 03/21/17   Janne Napoleon, NP  Phenyleph-CPM-DM-APAP (TYLENOL COLD HEAD CONGESTION PO) Take 1 tablet by mouth daily as needed (headache).    [provider]  predniSONE (DELTASONE) 10 MG tablet Take 2 tablets (20 mg total) by mouth daily. 03/21/17   Janne Napoleon, NP    Allergies    Patient has no known allergies.  Review of Systems   Review of Systems Ten systems are reviewed and are negative for acute change except as noted in the HPI  Physical Exam Updated Vital Signs BP 111/76   Pulse 96   Temp 100 F (37.8 C) (Oral)   Resp (!) 29   SpO2 96%   Physical Exam Constitutional:      General: He is not in acute distress.    Appearance: Normal appearance. He is well-developed. He is not ill-appearing or diaphoretic.  HENT:     Head: Normocephalic and atraumatic.     Jaw: There is normal jaw occlusion.     Mouth/Throat:     Mouth: Mucous membranes are  moist.     Pharynx: Oropharynx is clear.  Eyes:     General: Vision grossly intact. Gaze aligned appropriately.     Extraocular Movements: Extraocular movements intact.     Conjunctiva/sclera: Conjunctivae normal.     Pupils: Pupils are equal, round, and reactive to light.     Comments: EOMI without pain Visual fields grossly intact bilaterally  Neck:     Trachea: Trachea and phonation normal. No tracheal tenderness or tracheal deviation.     Meningeal: Brudzinski's sign absent.  Cardiovascular:     Rate and Rhythm: Normal rate and regular rhythm.  Pulmonary:     Effort: Pulmonary effort is normal. No respiratory distress.      Breath sounds: Normal breath sounds and air entry.  Abdominal:     General: There is no distension.     Palpations: Abdomen is soft.     Tenderness: There is no abdominal tenderness. There is no guarding or rebound.  Musculoskeletal:        General: Normal range of motion.     Cervical back: Normal range of motion and neck supple.  Skin:    General: Skin is warm and dry.  Neurological:     Mental Status: He is alert.     GCS: GCS eye subscore is 4. GCS verbal subscore is 5. GCS motor subscore is 6.     Comments: Speech is clear and goal oriented, follows commands Major Cranial nerves without deficit, no facial droop Normal strength in upper and lower extremities bilaterally including dorsiflexion and plantar flexion, strong and equal grip strength Sensation normal to light and sharp touch Moves extremities without ataxia, coordination intact  Psychiatric:        Behavior: Behavior normal.    ED Results / Procedures / Treatments   Labs (all labs ordered are listed, but only abnormal results are displayed) Labs Reviewed  CBC WITH DIFFERENTIAL/PLATELET - Abnormal; Notable for the following components:      Result Value   MCH 25.3 (*)    Lymphs Abs 0.5 (*)    All other components within normal limits  COMPREHENSIVE METABOLIC PANEL - Abnormal; Notable for the following components:   Potassium 3.3 (*)    CO2 20 (*)    Glucose, Bld 108 (*)    Calcium 8.4 (*)    ALT 50 (*)    All other components within normal limits  LIPASE, BLOOD    EKG None  Radiology DG Chest Portable 1 View  Result Date: 10/07/2019 CLINICAL DATA:  Shortness of breath, COVID-19 positive. EXAM: PORTABLE CHEST 1 VIEW COMPARISON:  October 27, 2015. FINDINGS: The heart size and mediastinal contours are within normal limits. No pneumothorax or pleural effusion is noted. Small patchy airspace opacities are noted in both lung bases concerning for infiltrates. The visualized skeletal structures are unremarkable.  IMPRESSION: Small patchy bibasilar airspace opacities are noted concerning for infiltrates. Electronically Signed   By: Lupita Raider M.D.   On: 10/07/2019 13:57    Procedures Procedures (including critical care time)  Medications Ordered in ED Medications  acetaminophen (TYLENOL) tablet 1,000 mg (has no administration in time range)  sodium chloride 0.9 % bolus 1,000 mL (1,000 mLs Intravenous New Bag/Given 10/07/19 1439)  metoCLOPramide (REGLAN) injection 5 mg (5 mg Intravenous Given 10/07/19 1440)  diphenhydrAMINE (BENADRYL) injection 25 mg (25 mg Intravenous Given 10/07/19 1440)    ED Course  I have reviewed the triage vital signs and the nursing notes.  Pertinent labs &  imaging results that were available during my care of the patient were reviewed by me and considered in my medical decision making (see chart for details).    MDM Rules/Calculators/A&P                          Additional history obtained from: 1. Nursing notes from this visit. 2. Electronic Medical Record system. ------------------------------------------------------- David Stevenson is a 33 y.o. male who presents to ED for migraine which is consistent with his prior headaches and without abnormal features. He did test positive for Covid 5 days ago, had symptoms those first few days which have since resolved. He has no upper respiratory symptoms at this time no shortness of breath or chest pain. Possible that headache is related to Covid but he has no meningeal signs on exam low suspicion for meningitis at this time. He denies any abnormal features, no trauma and he is overall well-appearing. Plan of care is to treat his migraine with a headache cocktail. Additional concern is that patient has been having vomiting for the last 2 days as well as some diarrhea, possible this is gastroenteritis related to Covid but his migraines are associated with vomiting in the past will obtain abdominal labs and give fluids nausea  medication and p.o. challenge. He has no peritoneal signs or focal tenderness on abdominal exam low suspicion for cholecystitis, appendicitis or other emergent intra-abdominal pathologies at this time. Additionally will obtain screening chest x-ray with Covid. He has no hypoxia or tachycardia on room air. EKG was obtained by triage staff. --------------------- CXR:  IMPRESSION:  Small patchy bibasilar airspace opacities are noted concerning for  infiltrates.   CBC without leukocytosis or anemia. CMP shows no emergent electrolyte derangement, AKI, gap or emergent LFT elevations. Lipase within normal limits no evidence of pancreatitis. Patient without urinary symptoms, UA canceled, doubt UTI.  Patient reevaluated at 3:50 PM, reports headache is resolved.  Plan of care is to p.o. challenge, reassess if improved and no recurrence of emesis feel patient can be discharged with return precautions and outpatient follow-up.  Care handoff given to [facility] PA-C at shift change, disposition per oncoming team, anticipate discharge.   David Stevenson was evaluated in Emergency Department on 10/07/2019 for the symptoms described in the history of present illness. He was evaluated in the context of the global COVID-19 pandemic, which necessitated consideration that the patient might be at risk for infection with the SARS-CoV-2 virus that causes COVID-19. Institutional protocols and algorithms that pertain to the evaluation of patients at risk for COVID-19 are in a state of rapid change based on information released by regulatory bodies including the CDC and federal and state organizations. These policies and algorithms were followed during the patient's care in the ED.  Note: Portions of this report may have been transcribed using voice recognition software. Every effort was made to ensure accuracy; however, inadvertent computerized transcription errors may still be present. Final Clinical Impression(s) / ED  Diagnoses Final diagnoses:  COVID-19 virus infection  Acute nonintractable headache, unspecified headache type  Non-intractable vomiting with nausea, unspecified vomiting type    Rx / DC Orders ED Discharge Orders    None       Elizabeth Palau 10/07/19 1602    Lorre Nick, MD 10/09/19 [phone]

## 2019-10-07 NOTE — ED Provider Notes (Signed)
Received patient at signout from [facility].  Refer to provider note for full history and physical examination.  Briefly patient is a 34 year old male presenting for evaluation of headaches with vomiting.  Also noted to be positive for COVID-19.  Work-up thus far has been reassuring and he is feeling much better.  Plan for p.o. challenge and if he tolerates this and he can go home.  He is mildly febrile so we will give him Tylenol and fluids.  MDM  Patient tolerated p.o. intake without difficulty.  Remains hemodynamically stable and in no distress.  Stable for discharge home.       Jeanie Sewer, PA-C 10/08/19 1656    Charlynne Pander, MD 10/12/19 [phone]

## 2019-10-07 NOTE — ED Notes (Signed)
Patient verbalizes understanding of discharge instructions. Opportunity for questioning and answers were provided. Armband removed by staff, pt discharged from ED.  

## 2019-10-07 NOTE — Discharge Instructions (Addendum)
At this time there does not appear to be the presence of an emergent medical condition, however there is always the potential for conditions to change. Please read and follow the below instructions.  Call the post Covid care center at [facility] to schedule follow-up appointment.  They will see Covid patients in person.  Please return to the Emergency Department immediately for any new or worsening symptoms. Please be sure to follow up with your Primary Care Provider within one week regarding your visit today; please call their office to schedule an appointment even if you are feeling better for a follow-up visit. You may use the antinausea medication Zofran as prescribed to help with your symptoms.  Please quarantine to avoid spreading Covid to others.  Drink plenty water to avoid dehydration and get plenty of rest.  Get help right away if: You have trouble breathing. You have pain or pressure in your chest. You have confusion. You have bluish lips and fingernails. You have difficulty waking from sleep. You have neck stiffness You have any new/concerning or worsening of symptoms These symptoms may represent a serious problem that is an emergency. Do not wait to see if the symptoms will go away. Get medical help right away. Call your local emergency services (911 in the [place] Do not drive yourself to the hospital. Let the emergency medical personnel know if you think you have COVID-19.  Please read the additional information packets attached to your discharge summary.  Do not take your medicine if  develop an itchy rash, swelling in your mouth or lips, or difficulty breathing; call 911 and seek immediate emergency medical attention if this occurs.  You may review your lab tests and imaging results in their entirety on your MyChart account.  Please discuss all results of fully with your primary care provider and other specialist at your follow-up visit.  Note: Portions of this text may have been  transcribed using voice recognition software. Every effort was made to ensure accuracy; however, inadvertent computerized transcription errors may still be present.

## 2019-10-09 ENCOUNTER — Other Ambulatory Visit: Payer: Self-pay

## 2019-10-09 ENCOUNTER — Emergency Department (HOSPITAL_COMMUNITY)
Admission: EM | Admit: 2019-10-09 | Discharge: 2019-10-09 | Disposition: A | Discharge status: Home / Self Care | Payer: Medicare Other | Attending: Emergency Medicine | Admitting: Emergency Medicine

## 2019-10-09 ENCOUNTER — Encounter (HOSPITAL_COMMUNITY): Payer: Self-pay | Admitting: *Deleted

## 2019-10-09 ENCOUNTER — Emergency Department (HOSPITAL_COMMUNITY): Payer: Medicare Other

## 2019-10-09 DIAGNOSIS — R079 Chest pain, unspecified: Secondary | ICD-10-CM | POA: Diagnosis present

## 2019-10-09 DIAGNOSIS — R0789 Other chest pain: Secondary | ICD-10-CM

## 2019-10-09 DIAGNOSIS — R0602 Shortness of breath: Secondary | ICD-10-CM

## 2019-10-09 DIAGNOSIS — Z23 Encounter for immunization: Secondary | ICD-10-CM | POA: Insufficient documentation

## 2019-10-09 DIAGNOSIS — R112 Nausea with vomiting, unspecified: Secondary | ICD-10-CM

## 2019-10-09 DIAGNOSIS — U071 COVID-19: Secondary | ICD-10-CM | POA: Diagnosis not present

## 2019-10-09 LAB — CBC WITH DIFFERENTIAL/PLATELET
Abs Immature Granulocytes: 0.02 10*3/uL (ref 0.00–0.07)
Basophils Absolute: 0 10*3/uL (ref 0.0–0.1)
Basophils Relative: 0 %
Eosinophils Absolute: 0 10*3/uL (ref 0.0–0.5)
Eosinophils Relative: 0 %
HCT: 43.6 % (ref 39.0–52.0)
Hemoglobin: 13.5 g/dL (ref 13.0–17.0)
Immature Granulocytes: 0 %
Lymphocytes Relative: 11 %
Lymphs Abs: 0.5 10*3/uL — ABNORMAL LOW (ref 0.7–4.0)
MCH: 25.7 pg — ABNORMAL LOW (ref 26.0–34.0)
MCHC: 31 g/dL (ref 30.0–36.0)
MCV: 82.9 fL (ref 80.0–100.0)
Monocytes Absolute: 0.3 10*3/uL (ref 0.1–1.0)
Monocytes Relative: 6 %
Neutro Abs: 3.9 10*3/uL (ref 1.7–7.7)
Neutrophils Relative %: 83 %
Platelets: 157 10*3/uL (ref 150–400)
RBC: 5.26 MIL/uL (ref 4.22–5.81)
RDW: 14.5 % (ref 11.5–15.5)
WBC: 4.8 10*3/uL (ref 4.0–10.5)
nRBC: 0 % (ref 0.0–0.2)

## 2019-10-09 LAB — COMPREHENSIVE METABOLIC PANEL
ALT: 50 U/L — ABNORMAL HIGH (ref 0–44)
AST: 57 U/L — ABNORMAL HIGH (ref 15–41)
Albumin: 3.2 g/dL — ABNORMAL LOW (ref 3.5–5.0)
Alkaline Phosphatase: 46 U/L (ref 38–126)
Anion gap: 12 (ref 5–15)
BUN: 13 mg/dL (ref 6–20)
CO2: 22 mmol/L (ref 22–32)
Calcium: 8.4 mg/dL — ABNORMAL LOW (ref 8.9–10.3)
Chloride: 106 mmol/L (ref 98–111)
Creatinine, Ser: 1.12 mg/dL (ref 0.61–1.24)
GFR calc Af Amer: >60 mL/min (ref >60)
GFR calc non Af Amer: >60 mL/min (ref >60)
Glucose, Bld: 93 mg/dL (ref 70–99)
Potassium: 3.4 mmol/L — ABNORMAL LOW (ref 3.5–5.1)
Sodium: 140 mmol/L (ref 135–145)
Total Bilirubin: 0.7 mg/dL (ref 0.3–1.2)
Total Protein: 7.1 g/dL (ref 6.5–8.1)

## 2019-10-09 MED ORDER — ACETAMINOPHEN 500 MG PO TABS
1000.0000 mg | ORAL_TABLET | Freq: Once | ORAL | Status: AC
  Administered 2019-10-09: 1000 mg via ORAL
  Filled 2019-10-09: qty 2

## 2019-10-09 MED ORDER — DIPHENHYDRAMINE HCL 50 MG/ML IJ SOLN: 50.0000 mg | Freq: Once | INTRAMUSCULAR | Status: DC | PRN

## 2019-10-09 MED ORDER — EPINEPHRINE 0.3 MG/0.3ML IJ SOAJ: 0.3000 mg | Freq: Once | INTRAMUSCULAR | Status: DC | PRN

## 2019-10-09 MED ORDER — METHYLPREDNISOLONE SODIUM SUCC 125 MG IJ SOLR: 125.0000 mg | Freq: Once | INTRAMUSCULAR | Status: DC | PRN

## 2019-10-09 MED ORDER — ONDANSETRON 4 MG PO TBDP: 4.0000 mg | ORAL_TABLET | Freq: Four times a day (QID) | ORAL | 0 refills | Status: DC | PRN

## 2019-10-09 MED ORDER — ONDANSETRON HCL 4 MG/2ML IJ SOLN
4.0000 mg | Freq: Once | INTRAMUSCULAR | Status: AC
  Administered 2019-10-09: 4 mg via INTRAVENOUS
  Filled 2019-10-09: qty 2

## 2019-10-09 MED ORDER — SODIUM CHLORIDE 0.9 % IV SOLN: INTRAVENOUS | Status: DC | PRN

## 2019-10-09 MED ORDER — ALBUTEROL SULFATE HFA 108 (90 BASE) MCG/ACT IN AERS: 2.0000 | INHALATION_SPRAY | Freq: Once | RESPIRATORY_TRACT | Status: DC | PRN

## 2019-10-09 MED ORDER — SODIUM CHLORIDE 0.9 % IV SOLN
1200.0000 mg | Freq: Once | INTRAVENOUS | Status: AC
  Administered 2019-10-09: 1200 mg via INTRAVENOUS
  Filled 2019-10-09 (×2): qty 10

## 2019-10-09 MED ORDER — LACTATED RINGERS IV BOLUS
1000.0000 mL | Freq: Once | INTRAVENOUS | Status: AC
  Administered 2019-10-09: 1000 mL via INTRAVENOUS

## 2019-10-09 MED ORDER — FAMOTIDINE IN NACL 20-0.9 MG/50ML-% IV SOLN: 20.0000 mg | Freq: Once | INTRAVENOUS | Status: DC | PRN

## 2019-10-09 NOTE — Discharge Instructions (Addendum)
Please take Tylenol (acetaminophen) to relieve your pain, and fever  You may take tylenol, up to 1,000 mg (two extra strength pills).  Do not take more than 3,000 mg tylenol in a 24 hour period.  Please check all medication labels as many medications such as pain and cold medications may contain tylenol. Please do not drink alcohol while taking this medication.   Today you got a monoclonal antibody infusion.  If your symptoms worsen, or you develop new concerns please seek additional medical care and evaluation.  I have given you a prescription for Zofran which is a medicine for nausea so that you can have your self hydrated.

## 2019-10-09 NOTE — ED Notes (Signed)
Pt speaking in full sentences without difficulty. Pt  In no acute distress. VSS, Skin warm and dry. Lungs CTA.

## 2019-10-09 NOTE — ED Notes (Signed)
Patient signed consent form for Regen-Cov IV medication with no objections .

## 2019-10-09 NOTE — Progress Notes (Signed)
Pharmacy COVID-19 Monoclonal Antibody Screening  David Stevenson was identified as being not hospitalized with symptoms from Covid-19 on admission but an incidental positive PCR has been documented.  The patient may qualify for the use of monoclonal antibodies (mAB) for COVID-19 viral infection to prevent worsening symptoms stemming from Covid-19 infection.  The patient was identified based on a positive COVID-19 PCR and not requiring the use of supplemental oxygen at this time.  This patient meets the FDA criteria for Emergency Use Authorization of casirivimab/imdevimab.  Has a (+) direct SARS-CoV-2 viral test result  Is NOT hospitalized due to COVID-19  Is within 10 days of symptom onset  Has at least one of the high risk factor(s) for progression to severe COVID-19 and/or hospitalization as defined in EUA.  Specific high risk criteria : BMI > 25  Additionally: The patient has not had a positive COVID-19 PCR in the last 90 days.  The patient is unvaccinated against COVID-19.  Since the patient is unvaccinated and meets high risk criteria, the patient is eligible for mAB administration.   This eligibility and indication for treatment was discussed with the patients physician: Lyndel Safe, PA  Plan: Based on the above discussion, it was decided that the patient will receive one dose of mAB combination.Casirivimab/imdevimab has been ordered. Pharmacy will coordinate administration timing with patient's nurse. Recommended infusion monitoring parameters communicated to the nursing team.  [name], PharmD PGY1 Acute Care Pharmacy Resident Please refer to [facility] for unit-specific pharmacist

## 2019-10-09 NOTE — ED Notes (Signed)
ED Provider at bedside. 

## 2019-10-09 NOTE — ED Triage Notes (Signed)
Pt reports being covid + two weeks ago and still feeling bad. Has been coughing a lot and now has chest pain pain, left side upper abd pain, back pain. Pain increases when breathing and coughing. Airway intact, no resp distress noted.

## 2019-10-09 NOTE — ED Notes (Signed)
Attempted to get blood. Only able to get a small amount (not enough for test). Will try later.

## 2019-10-09 NOTE — ED Provider Notes (Signed)
[facility] EMERGENCY DEPARTMENT Provider Note   CSN: [id number] Arrival date & time: 10/09/19  1015     History No chief complaint on file.   David Stevenson is a 34 y.o. male with past medical history of migraines, on day 9 of COVID-19 symptoms, who presents today for evaluation of chest pain, shortness of breath and cough.  He was previously seen in the ER on 9/13 for similar along with headaches and vomiting.  He reports that since then he has been unable to tolerate p.o. intake.  States that he has vomited at least 3 times today including water and electrolyte solution.  He denies any abdominal pain.  His chest pain is made worse with coughing.  It is made better with being still however constantly hurts.  He denies any leg swelling.  He is not vaccinated against Covid.  He reports that his fevers have resolved.  He has not had any monoclonal antibody infusions.   HPI     Past Medical History:  Diagnosis Date  . Migraine   . Seizures (HCC)    pt last seizure appx 14 years ago    There are no problems to display for this patient.   Past Surgical History:  Procedure Laterality Date  . ABDOMINAL SURGERY         Family History  Problem Relation Age of Onset  . Seizures Father     Social History   Tobacco Use  . Smoking status: Never Smoker  . Smokeless tobacco: Never Used  Vaping Use  . Vaping Use: Never used  Substance Use Topics  . Alcohol use: No  . Drug use: No    Home Medications Prior to Admission medications   Medication Sig Start Date End Date Taking? Authorizing Provider  acetaminophen (TYLENOL) 500 MG tablet Take 1,000 mg by mouth every 6 (six) hours as needed for headache.    [provider]  cetirizine (ZYRTEC) 10 MG tablet Take 10 mg by mouth daily as needed for allergies. Reported on 05/26/2015    [provider]  cyclobenzaprine (FLEXERIL) 10 MG tablet Take 1 tablet (10 mg total) by mouth 2 (two) times  daily as needed for muscle spasms. 03/21/17   Janne Napoleon, NP  ondansetron (ZOFRAN ODT) 4 MG disintegrating tablet Take 1 tablet (4 mg total) by mouth every 6 (six) hours as needed for nausea or vomiting. 10/09/19   Cristina Gong, PA-C  Phenyleph-CPM-DM-APAP (TYLENOL COLD HEAD CONGESTION PO) Take 1 tablet by mouth daily as needed (headache).    [provider]  predniSONE (DELTASONE) 10 MG tablet Take 2 tablets (20 mg total) by mouth daily. 03/21/17   Janne Napoleon, NP    Allergies    Patient has no known allergies.  Review of Systems   Review of Systems  Constitutional: Positive for appetite change, chills and fatigue. Negative for fever.  HENT: Negative for congestion.   Respiratory: Positive for cough, chest tightness and shortness of breath.   Cardiovascular: Positive for chest pain. Negative for palpitations and leg swelling.  Gastrointestinal: Positive for abdominal pain, nausea and vomiting.  Genitourinary: Negative for dysuria.  Musculoskeletal: Positive for arthralgias and myalgias.  Skin: Negative for color change, pallor and rash.  Neurological: Negative for weakness and headaches.  All other systems reviewed and are negative.   Physical Exam Updated Vital Signs BP 130/87 (BP Location: Left Arm)   Pulse 89   Temp 99.1 F (37.3 C) (Oral)  Resp (!) 22   Ht 5\' 8"  (1.727 m)   Wt 112 kg   SpO2 94%   BMI 37.56 kg/m   Physical Exam Vitals and nursing note reviewed.  Constitutional:      General: He is not in acute distress.    Appearance: He is not ill-appearing.  Eyes:     Conjunctiva/sclera: Conjunctivae normal.  Cardiovascular:     Rate and Rhythm: Normal rate.  Pulmonary:     Effort: Pulmonary effort is normal. Tachypnea (Borderline) present.     Breath sounds: Normal air entry. No decreased breath sounds, wheezing or rhonchi.  Chest:     Comments: There is diffuse tenderness to palpation over sternocostal joints bilaterally.  Palpation here  both recreates and exacerbates his reported pain.   Musculoskeletal:     Cervical back: Normal range of motion and neck supple.  Skin:    General: Skin is warm.  Neurological:     Mental Status: He is alert.     Comments: Awake and alert, answers all questions appropriately.  Speech is not slurred.  Psychiatric:        Mood and Affect: Mood normal.        Behavior: Behavior normal.     ED Results / Procedures / Treatments   Labs (all labs ordered are listed, but only abnormal results are displayed) Labs Reviewed  CBC WITH DIFFERENTIAL/PLATELET - Abnormal; Notable for the following components:      Result Value   MCH 25.7 (*)    Lymphs Abs 0.5 (*)    All other components within normal limits  COMPREHENSIVE METABOLIC PANEL - Abnormal; Notable for the following components:   Potassium 3.4 (*)    Calcium 8.4 (*)    Albumin 3.2 (*)    AST 57 (*)    ALT 50 (*)    All other components within normal limits    EKG EKG Interpretation  Date/Time:  Wednesday October 09 2019 15:39:41 EDT Ventricular Rate:  88 PR Interval:    QRS Duration: 99 QT Interval:  352 QTC Calculation: 426 R Axis:   46 Text Interpretation: Sinus rhythm Since last tracing rate slower Confirmed by [id number] [phone]) on 10/09/2019 6:23:49 PM   Radiology DG Chest Port 1 View  Result Date: 10/09/2019 CLINICAL DATA:  COVID positive cough and shortness of breath for a week EXAM: PORTABLE CHEST 1 VIEW COMPARISON:  10/07/2019 FINDINGS: Cardiomediastinal contours and hilar structures are stable accounting for low lung volumes, diminished since prior study and portable technique. Patchy bilateral peripheral opacities throughout the chest have developed since the previous radiograph. No sign of pleural effusion. On limited assessment no acute skeletal process. IMPRESSION: Interval development of patchy bilateral peripheral opacities throughout the chest, suspicious for multifocal pneumonia, compatible with given  history of COVID-19 infection. Electronically Signed   By: 10/09/2019 M.D.   On: 10/09/2019 11:30    Procedures Procedures (including critical care time)  Medications Ordered in ED Medications  0.9 %  sodium chloride infusion (has no administration in time range)  diphenhydrAMINE (BENADRYL) injection 50 mg (has no administration in time range)  famotidine (PEPCID) IVPB 20 mg premix (has no administration in time range)  methylPREDNISolone sodium succinate (SOLU-MEDROL) 125 mg/2 mL injection 125 mg (has no administration in time range)  albuterol (VENTOLIN HFA) 108 (90 Base) MCG/ACT inhaler 2 puff (has no administration in time range)  EPINEPHrine (EPI-PEN) injection 0.3 mg (has no administration in time range)  ondansetron (ZOFRAN) injection 4  mg (4 mg Intravenous Given 10/09/19 1651)  lactated ringers bolus 1,000 mL (0 mLs Intravenous Stopped 10/09/19 1946)  acetaminophen (TYLENOL) tablet 1,000 mg (1,000 mg Oral Given 10/09/19 1720)  casirivimab-imdevimab (REGEN-COV) 1,200 mg in sodium chloride 0.9 % 110 mL IVPB (0 mg Intravenous Stopped 10/09/19 2015)    ED Course  I have reviewed the triage vital signs and the nursing notes.  Pertinent labs & imaging results that were available during my care of the patient were reviewed by me and considered in my medical decision making (see chart for details).  Clinical Course as of Oct 09 2127  Wed Oct 09, 2019  1546 In room patient's blood pressure is 93 systolic.  Suspect this is secondary to dehydration.  Antiemetics and gentle hydration ordered.   [EH]  1546 Patient is reevaluated.  He feels much better after IV fluids and Tylenol.  Personally ambulated him in the room, he remained 96-97+ percent on room air without significant symptoms or dyspnea.  He has updated his wife and family therefore provider will not update.  He is aware he will most likely get a call for monoclonal antibody clinic.    [EH]  1849 Patient is actually on day 9 of  symptoms.  Plan to give monoclonal antibody infusion.  I discussed risks and benefits with patient who wishes to proceed with Mab infusion.   [EH]  2053 MAB infusion finished at 2015    [EH]    Clinical Course User Index [EH] Norman Clay   MDM Rules/Calculators/A&P                         Patient is a 34 year old man who presents today for evaluation of cough and shortness of breath.  He is on day 9 of COVID-19, has not had any monoclonal antibody or other specific treatments.  He is here today due to chest pain and shortness of breath.  Chest x-ray is obtained showing that, since 2 days ago, he has patchy bilateral opacities consistent with COVID-19.  He reports he has not tolerated p.o. intake for 2 days.  EKG is obtained showing no evidence of acute ischemia.  His chest pain is recreated and exacerbated with palpation, and I suspect this is musculoskeletal chest pain secondary to coughing.    My initial evaluation his blood pressure was 93 systolic.  Plan for IV fluids, antiemetics, basic labs and repeat evaluation.  Labs are obtained and reviewed, CBC is unremarkable.  CMP shows mild transaminitis, consistent with COVID-19.  I ambulated patient in the room, he maintained oxygen saturation 96 to 97% on room air.  Initially had been reported that patient was on day 7, however on further evaluation it appears he is on day 9 of Covid symptoms.  He does qualify for the Mab infusion based on his BMI of 37.5.  While he does not have a positive test in our system he reports he had a positive PCR test at the pharmacy as an outpatient.  Given that he is on day 9, consideration for monoclonal antibody infusion in the emergency room.  I spoke with patient about this.  We discussed the use of monoclonal antibody, that it is an emergency use authorization, and the risks and possible benefits.  Patient states his understanding and wishes for Mab infusion.  He is given monoclonal antibody  infusion in the ER with no evidence of significant complications.  He is discharged with a prescription for  Zofran to ensure he maintains adequate p.o. hydration.  He was observed in the ER for over one hour after infusion completed.   Patient does not appear to require admission at this time as he is not hypoxic or hypotensive, is able to tolerate p.o. intake, and reports he feels much better after antiemetics, Tylenol, and fluids.   David Stevenson was evaluated in Emergency Department on 10/09/2019 for the symptoms described in the history of present illness. He was evaluated in the context of the global COVID-19 pandemic, which necessitated consideration that the patient might be at risk for infection with the SARS-CoV-2 virus that causes COVID-19. Institutional protocols and algorithms that pertain to the evaluation of patients at risk for COVID-19 are in a state of rapid change based on information released by regulatory bodies including the CDC and federal and state organizations. These policies and algorithms were followed during the patient's care in the ED.  Return precautions were discussed with patient who states their understanding.  At the time of discharge patient denied any unaddressed complaints or concerns.  Patient is agreeable for discharge home.  Note: Portions of this report may have been transcribed using voice recognition software. Every effort was made to ensure accuracy; however, inadvertent computerized transcription errors may be present  Final Clinical Impression(s) / ED Diagnoses Final diagnoses:  COVID-19  Non-intractable vomiting with nausea, unspecified vomiting type  Acute chest wall pain    Rx / DC Orders ED Discharge Orders         Ordered    ondansetron (ZOFRAN ODT) 4 MG disintegrating tablet  Every 6 hours PRN        10/09/19 1819           Norman ClayHammond, Betrice Wanat W, PA-C 10/09/19 2133    Linwood DibblesKnapp, Jon, MD 10/10/19 1538

## 2019-10-12 ENCOUNTER — Emergency Department (HOSPITAL_COMMUNITY): Payer: Medicare Other

## 2019-10-12 ENCOUNTER — Encounter (HOSPITAL_COMMUNITY): Payer: Self-pay | Admitting: Emergency Medicine

## 2019-10-12 ENCOUNTER — Emergency Department (HOSPITAL_COMMUNITY)
Admission: EM | Admit: 2019-10-12 | Discharge: 2019-10-12 | Disposition: A | Discharge status: Home / Self Care | Payer: Medicare Other | Attending: Emergency Medicine | Admitting: Emergency Medicine

## 2019-10-12 ENCOUNTER — Other Ambulatory Visit: Payer: Self-pay

## 2019-10-12 DIAGNOSIS — Z79899 Other long term (current) drug therapy: Secondary | ICD-10-CM | POA: Insufficient documentation

## 2019-10-12 DIAGNOSIS — R0602 Shortness of breath: Secondary | ICD-10-CM | POA: Diagnosis present

## 2019-10-12 DIAGNOSIS — U071 COVID-19: Secondary | ICD-10-CM | POA: Diagnosis not present

## 2019-10-12 HISTORY — DX: COVID-19: U07.1

## 2019-10-12 LAB — CBC WITH DIFFERENTIAL/PLATELET
Abs Immature Granulocytes: 0.09 10*3/uL — ABNORMAL HIGH (ref 0.00–0.07)
Basophils Absolute: 0 10*3/uL (ref 0.0–0.1)
Basophils Relative: 0 %
Eosinophils Absolute: 0 10*3/uL (ref 0.0–0.5)
Eosinophils Relative: 0 %
HCT: 44.2 % (ref 39.0–52.0)
Hemoglobin: 13.6 g/dL (ref 13.0–17.0)
Immature Granulocytes: 1 %
Lymphocytes Relative: 17 %
Lymphs Abs: 1.2 10*3/uL (ref 0.7–4.0)
MCH: 25.6 pg — ABNORMAL LOW (ref 26.0–34.0)
MCHC: 30.8 g/dL (ref 30.0–36.0)
MCV: 83.1 fL (ref 80.0–100.0)
Monocytes Absolute: 0.6 10*3/uL (ref 0.1–1.0)
Monocytes Relative: 8 %
Neutro Abs: 5.1 10*3/uL (ref 1.7–7.7)
Neutrophils Relative %: 74 %
Platelets: 272 10*3/uL (ref 150–400)
RBC: 5.32 MIL/uL (ref 4.22–5.81)
RDW: 14.3 % (ref 11.5–15.5)
WBC: 6.9 10*3/uL (ref 4.0–10.5)
nRBC: 0 % (ref 0.0–0.2)

## 2019-10-12 LAB — COMPREHENSIVE METABOLIC PANEL
ALT: 55 U/L — ABNORMAL HIGH (ref 0–44)
AST: 48 U/L — ABNORMAL HIGH (ref 15–41)
Albumin: 2.8 g/dL — ABNORMAL LOW (ref 3.5–5.0)
Alkaline Phosphatase: 41 U/L (ref 38–126)
Anion gap: 12 (ref 5–15)
BUN: 9 mg/dL (ref 6–20)
CO2: 23 mmol/L (ref 22–32)
Calcium: 8.6 mg/dL — ABNORMAL LOW (ref 8.9–10.3)
Chloride: 105 mmol/L (ref 98–111)
Creatinine, Ser: 0.97 mg/dL (ref 0.61–1.24)
GFR calc Af Amer: >60 mL/min (ref >60)
GFR calc non Af Amer: >60 mL/min (ref >60)
Glucose, Bld: 90 mg/dL (ref 70–99)
Potassium: 3.4 mmol/L — ABNORMAL LOW (ref 3.5–5.1)
Sodium: 140 mmol/L (ref 135–145)
Total Bilirubin: 0.8 mg/dL (ref 0.3–1.2)
Total Protein: 6.9 g/dL (ref 6.5–8.1)

## 2019-10-12 LAB — TROPONIN I (HIGH SENSITIVITY): Troponin I (High Sensitivity): 6 ng/L (ref <18)

## 2019-10-12 MED ORDER — ALBUTEROL SULFATE HFA 108 (90 BASE) MCG/ACT IN AERS
8.0000 | INHALATION_SPRAY | Freq: Once | RESPIRATORY_TRACT | Status: AC
  Administered 2019-10-12: 8 via RESPIRATORY_TRACT
  Filled 2019-10-12: qty 6.7

## 2019-10-12 MED ORDER — PREDNISONE 20 MG PO TABS: 40.0000 mg | ORAL_TABLET | Freq: Every day | ORAL | 0 refills | Status: AC

## 2019-10-12 MED ORDER — IOHEXOL 350 MG/ML SOLN
75.0000 mL | Freq: Once | INTRAVENOUS | Status: AC | PRN
  Administered 2019-10-12: 75 mL via INTRAVENOUS

## 2019-10-12 MED ORDER — AEROCHAMBER PLUS FLO-VU MEDIUM MISC
1.0000 | Freq: Once | Status: DC
  Filled 2019-10-12: qty 1

## 2019-10-12 NOTE — ED Provider Notes (Signed)
Care assumed from Sharen Heck, PA-c at shift change with labs and CTA pending.   In brief, this patient is a 34 y.o. M with known COVID who presents for evaluation of SOB.  Patient reports has been ongoing since he got diagnosed with Covid but feels like it is getting worse.  He is coughing and states that he has worsening dyspnea on exertion.  No chest pain.  He did receive monoclonal antibody infusion clinic.  Please see note from previous provider for full history/physical exam.  Physical Exam  BP 118/78   Pulse 87   Temp 98.9 F (37.2 C) (Oral)   Resp (!) 26   Wt 112 kg   SpO2 92%   BMI 37.56 kg/m   Physical Exam  NAD   ED Course/Procedures   Clinical Course as of Oct 12 1919  Sat Oct 12, 2019  1451 Bilateral pulmonary infiltrates persist. The pulmonary infiltrates are more pronounced in the interval suggesting interval worsening. At least some of the difference could be technical in nature as the technique between the 2 studies is very different. There has been definitive significant worsening since October 07, 2019.    DG Chest Portable 1 View [CG]    Clinical Course User Index [CG] Liberty Handy, PA-C    Procedures  MDM   PLAN: Patient pending labs and CTA of chest.  We will plan to reassess and ambulate with pulse ox.  MDM:  CTA shows slightly suboptimal opacification of the main pulmonary artery but no central or proximal segmental pulmonary embolism noted.  Extensive multifocal airspace opacities noted.  Patient ambulated in ED maintaining O2 sats between 90-94%.  Reevaluation.  He is sitting comfortably with O2 sats maintaining 95-97%.  At this time, patient is not hypoxic.  Do not feel that he needs admission.  Will discharge patient home.  Patient instructed on at home supportive care measures.  Instructed patient to closely monitor symptoms and return the emergency department for any worsening or concerning symptoms. Patient had ample  opportunity for questions and discussion. All patient's questions were answered with full understanding. Strict return precautions discussed. Patient expresses understanding and agreement to plan.    1. Shortness of breath   2. COVID-19    Portions of this note were generated with Dragon dictation software. Dictation errors may occur despite best attempts at proofreading.      Maxwell Caul, PA-C 10/12/19 1921    Charlynne Pander, MD 10/13/19 [phone]

## 2019-10-12 NOTE — Discharge Instructions (Signed)
Your workup today was resassuring.   Your chest CT did not show any obvious blood clot in your lung.   Take prednisone as directed.   Follow up with the COVID care clinic.   Closely monitor your symptoms and return to the Emergency Dept for any worsening shortness of breath, chest pain or any other worsening or concerning sypmtoms.

## 2019-10-12 NOTE — ED Notes (Signed)
Patient verbalizes understanding of discharge instructions. Opportunity for questioning and answers were provided. Arm band removed by staff, patient discharged from ED. 

## 2019-10-12 NOTE — ED Triage Notes (Signed)
Pt reports COVID + 2 weeks ago.  Reports increased SOB x 2 days.  Denies fever.

## 2019-10-12 NOTE — ED Notes (Signed)
Pt ambulated in room with pulse ox. Spo2 remained between 90-94 while ambulating. Pt  advised breathing felt "fine". No labored breathing noted.

## 2019-10-12 NOTE — ED Notes (Signed)
Pt to CT

## 2019-10-12 NOTE — ED Provider Notes (Signed)
[facility] EMERGENCY DEPARTMENT Provider Note   CSN: [id number] Arrival date & time: 10/12/19  1235     History Chief Complaint  Patient presents with   Covid Positive   Shortness of Breath    David Stevenson is a 34 y.o. male with history of Covid diagnosis on 9/6 presents to the ER for evaluation of shortness of breath.  States this has been ongoing since his diagnosis however it has worsened.  Shortness of breath is constant.  Is much worse when he is coughing, getting up and moving and with exertion.  It is better if he sits down and rests.  Has associated cough but also feels like it is worsening, dry.  Feels like taking a deep breath makes him cough more but no pain with breathing.  Denies any pain actually.  No chest pain.  Denies fever.  Had some vomiting, diarrhea and abdominal pain but this has resolved.  This is her third visit in the last week for Covid related symptoms.  He received a monoclonal antibody infusion at his last ER visit.  He was discharged with Zofran which has been helping with his nausea.  No known cardiac or pulmonary problems.  No cigarette use.  No history of DVT or PE.  No recent surgery, prolonged sitting or immobilization, hemoptysis, calf pain or swelling, hormone therapy, active cancer treatment.  States he has a pulse oximeter and has been checking his oxygen levels, states they are good and not lower than 90%.  HPI     Past Medical History:  Diagnosis Date   COVID-19    Migraine    Seizures (HCC)    pt last seizure appx 14 years ago    There are no problems to display for this patient.   Past Surgical History:  Procedure Laterality Date   ABDOMINAL SURGERY         Family History  Problem Relation Age of Onset   Seizures Father     Social History   Tobacco Use   Smoking status: Never Smoker   Smokeless tobacco: Never Used  Vaping Use   Vaping Use: Never used  Substance Use Topics   Alcohol use:  No   Drug use: No    Home Medications Prior to Admission medications   Medication Sig Start Date End Date Taking? Authorizing Provider  acetaminophen (TYLENOL) 500 MG tablet Take 1,000 mg by mouth every 6 (six) hours as needed for headache.    [provider]  cetirizine (ZYRTEC) 10 MG tablet Take 10 mg by mouth daily as needed for allergies. Reported on 05/26/2015    [provider]  cyclobenzaprine (FLEXERIL) 10 MG tablet Take 1 tablet (10 mg total) by mouth 2 (two) times daily as needed for muscle spasms. 03/21/17   Janne Napoleon, NP  ondansetron (ZOFRAN ODT) 4 MG disintegrating tablet Take 1 tablet (4 mg total) by mouth every 6 (six) hours as needed for nausea or vomiting. 10/09/19   Cristina Gong, PA-C  Phenyleph-CPM-DM-APAP (TYLENOL COLD HEAD CONGESTION PO) Take 1 tablet by mouth daily as needed (headache).    [provider]  predniSONE (DELTASONE) 10 MG tablet Take 2 tablets (20 mg total) by mouth daily. 03/21/17   Janne Napoleon, NP    Allergies    Patient has no known allergies.  Review of Systems   Review of Systems  Respiratory: Positive for cough and shortness of breath.   Cardiovascular: Positive for chest pain.  All other systems reviewed and are negative.   Physical Exam Updated Vital Signs BP (!) 140/103    Pulse 75    Temp 98.5 F (36.9 C) (Oral)    Resp 12    Wt 112 kg    SpO2 93%    BMI 37.56 kg/m   Physical Exam Vitals and nursing note reviewed.  Constitutional:      General: He is not in acute distress.    Appearance: He is well-developed.     Comments: NAD.  HENT:     Head: Normocephalic and atraumatic.     Right Ear: External ear normal.     Left Ear: External ear normal.     Nose: Nose normal.  Eyes:     General: No scleral icterus.    Conjunctiva/sclera: Conjunctivae normal.  Cardiovascular:     Rate and Rhythm: Normal rate and regular rhythm.     Heart sounds: Normal heart sounds. No murmur heard.      Comments:  1+ radial and DP pulses bilaterally.  No lower extremity edema.  No calf tenderness. Pulmonary:     Effort: Pulmonary effort is normal.     Breath sounds: Examination of the right-lower field reveals decreased breath sounds. Examination of the left-lower field reveals decreased breath sounds. Decreased breath sounds present.     Comments: No reproducible chest wall tenderness. Musculoskeletal:        General: No deformity. Normal range of motion.     Cervical back: Normal range of motion and neck supple.  Skin:    General: Skin is warm and dry.     Capillary Refill: Capillary refill takes less than 2 seconds.  Neurological:     Mental Status: He is alert and oriented to person, place, and time.  Psychiatric:        Behavior: Behavior normal.        Thought Content: Thought content normal.        Judgment: Judgment normal.     ED Results / Procedures / Treatments   Labs (all labs ordered are listed, but only abnormal results are displayed) Labs Reviewed  CBC WITH DIFFERENTIAL/PLATELET  COMPREHENSIVE METABOLIC PANEL  TROPONIN I (HIGH SENSITIVITY)    EKG None  Radiology DG Chest Portable 1 View  Result Date: 10/12/2019 CLINICAL DATA:  Shortness of breath.  COVID-19 positive. EXAM: PORTABLE CHEST 1 VIEW COMPARISON:  October 09, 2019 FINDINGS: Bilateral patchy pulmonary infiltrates are identified. The infiltrates are more prominent the interval, particularly on the left. No pneumothorax. No other acute abnormalities. IMPRESSION: Bilateral pulmonary infiltrates persist. The pulmonary infiltrates are more pronounced in the interval suggesting interval worsening. At least some of the difference could be technical in nature as the technique between the 2 studies is very different. There has been definitive significant worsening since October 07, 2019. Electronically Signed   By: Gerome Sam III M.D   On: 10/12/2019 14:01    Procedures Procedures (including critical care  time)  Medications Ordered in ED Medications  albuterol (VENTOLIN HFA) 108 (90 Base) MCG/ACT inhaler 8 puff (has no administration in time range)  AeroChamber Plus Flo-Vu Medium MISC 1 each (has no administration in time range)    ED Course  I have reviewed the triage vital signs and the nursing notes.  Pertinent labs & imaging results that were available during my care of the patient were reviewed by me and considered in my medical decision making (see chart for details).  Clinical Course as of  Oct 12 1451  Sat Oct 12, 2019  1451 Bilateral pulmonary infiltrates persist. The pulmonary infiltrates are more pronounced in the interval suggesting interval worsening. At least some of the difference could be technical in nature as the technique between the 2 studies is very different. There has been definitive significant worsening since October 07, 2019.    DG Chest Portable 1 View [CG]    Clinical Course User Index [CG] Jerrell Mylar   MDM Rules/Calculators/A&P                          34 year old male with history of Covid diagnosis on 9/6 presents with progressing exertional shortness of breath.  Has lingering dry cough that also is worse.  Chest pain only with deep breathing and coughing.  EMR, triage nursing notes reviewed to assist with obtaining more history and MDM.  This is her third ER visit for Covid related symptoms.  Received a monoclonal antibody infusion at last ER visit.  Chest x-ray ordered by triage RN, this was personally visualized and interpreted.  Chest x-ray is shows progression of worsening Covid related infiltrates.  EKG shows nonspecific T wave flattening in V5 and V6.  Given duration of symptoms, worsening shortness of breath and chest x-ray, will obtain screening labs including CBC, CMP, troponin and CT angio to rule out a PE.  I have ordered albuterol 8 puffs, cardiac monitoring, continuous pulse ox.  1500: Patient will be handed off to  oncoming ED PA who will follow up on labs, CT angiogram.  Recommend ambulation with pulse ox.  If all benign, appropriate for discharge with Covid clinic follow-up.  Plan discussed and agreed upon with patient.  Final Clinical Impression(s) / ED Diagnoses Final diagnoses:  Shortness of breath    Rx / DC Orders ED Discharge Orders    None       Liberty Handy, PA-C 10/12/19 1454    Virgina Norfolk, DO 10/12/19 1457

## 2019-10-28 ENCOUNTER — Ambulatory Visit (INDEPENDENT_AMBULATORY_CARE_PROVIDER_SITE_OTHER): Payer: Medicaid Other | Admitting: Nurse Practitioner

## 2019-10-28 VITALS — BP 148/96 | HR 83 | Temp 97.3°F | Ht 64.0 in | Wt 239.0 lb

## 2019-10-28 DIAGNOSIS — R059 Cough, unspecified: Secondary | ICD-10-CM

## 2019-10-28 DIAGNOSIS — Z8616 Personal history of COVID-19: Secondary | ICD-10-CM

## 2019-10-28 MED ORDER — PREDNISONE 20 MG PO TABS: 20.0000 mg | ORAL_TABLET | Freq: Every day | ORAL | 0 refills | Status: AC

## 2019-10-28 NOTE — Progress Notes (Signed)
@Patient  ID: , male    DOB: 27-Jun-1985, 34 y.o.   MRN: [id number]  Chief Complaint  Patient presents with  . New Patient (Initial Visit)    COVID Pos: 9/13 ED: 9/13,9/15 (Infusion) 9/18 was given prednisone.  . Shortness of Breath  . Cough    Referring provider: 10/18, NP   34 year old male with history of migraines, seizures.  Diagnosed with Covid on 10/07/2019.  HPI  Patient presents today for post COVID care clinic visit.  He was diagnosed with Covid on 10/07/2019.  He has had multiple ED visits over the past few weeks.  Patient was diagnosed with bilateral Covid pneumonia.  He did receive monoclonal antibody infusion on 10/09/2019.  He did have a round of prednisone on 10/12/2019.  Patient was prescribed albuterol inhaler that he is using as needed.  He states that overall he is improving.  He does still get short of breath with exertion and performing activities of daily living.  He does still have a slight cough.  He has been try to do deep breathing exercises at home. Denies f/c/s, n/v/d, hemoptysis, PND, chest pain or edema.      No Known Allergies  Immunization History  Administered Date(s) Administered  . Tdap 05/26/2015    Past Medical History:  Diagnosis Date  . COVID-19   . Migraine   . Seizures (HCC)    pt last seizure appx 14 years ago    Tobacco History: Social History   Tobacco Use  Smoking Status Never Smoker  Smokeless Tobacco Never Used   Counseling given: Not Answered   Outpatient Encounter Medications as of 10/28/2019  Medication Sig  . acetaminophen (TYLENOL) 500 MG tablet Take 1,000 mg by mouth every 6 (six) hours as needed for headache. (Patient not taking: Reported on 10/28/2019)  . cetirizine (ZYRTEC) 10 MG tablet Take 10 mg by mouth daily as needed for allergies. Reported on 05/26/2015 (Patient not taking: Reported on 10/28/2019)  . cyclobenzaprine (FLEXERIL) 10 MG tablet Take 1 tablet (10 mg total) by mouth 2 (two)  times daily as needed for muscle spasms. (Patient not taking: Reported on 10/28/2019)  . ondansetron (ZOFRAN ODT) 4 MG disintegrating tablet Take 1 tablet (4 mg total) by mouth every 6 (six) hours as needed for nausea or vomiting. (Patient not taking: Reported on 10/28/2019)  . Phenyleph-CPM-DM-APAP (TYLENOL COLD HEAD CONGESTION PO) Take 1 tablet by mouth daily as needed (headache). (Patient not taking: Reported on 10/28/2019)  . predniSONE (DELTASONE) 20 MG tablet Take 1 tablet (20 mg total) by mouth daily with breakfast for 5 days.   No facility-administered encounter medications on file as of 10/28/2019.     Review of Systems  Review of Systems  Constitutional: Negative for fatigue and fever.  HENT: Negative.   Respiratory: Positive for cough and shortness of breath.   Cardiovascular: Negative.  Negative for chest pain, palpitations and leg swelling.  Gastrointestinal: Negative.   Allergic/Immunologic: Negative.   Neurological: Negative.   Psychiatric/Behavioral: Negative.        Physical Exam  BP (!) 148/96 (BP Location: Right Arm)   Pulse 83   Temp (!) 97.3 F (36.3 C)   Ht 5\' 4"  (1.626 m)   Wt 239 lb (108.4 kg)   SpO2 97%   BMI 41.02 kg/m   Wt Readings from Last 5 Encounters:  10/28/19 239 lb (108.4 kg)  10/12/19 247 lb (112 kg)  10/09/19 247 lb (112 kg)  05/26/15 231 lb (104.8  kg)  11/18/13 213 lb (96.6 kg)     Physical Exam Vitals and nursing note reviewed.  Constitutional:      General: He is not in acute distress.    Appearance: He is well-developed.  Cardiovascular:     Rate and Rhythm: Normal rate and regular rhythm.  Pulmonary:     Effort: Pulmonary effort is normal.     Breath sounds: Normal breath sounds. No wheezing or rhonchi.  Skin:    General: Skin is warm and dry.  Neurological:     Mental Status: He is alert and oriented to person, place, and time.      Imaging: CT Angio Chest PE W and/or Wo Contrast  Result Date: 10/12/2019 CLINICAL  DATA:  COVID positive shortness of breath EXAM: CT ANGIOGRAPHY CHEST WITH CONTRAST TECHNIQUE: Multidetector CT imaging of the chest was performed using the standard protocol during bolus administration of intravenous contrast. Multiplanar CT image reconstructions and MIPs were obtained to evaluate the vascular anatomy. CONTRAST:  22mL OMNIPAQUE IOHEXOL 350 MG/ML SOLN COMPARISON:  None. FINDINGS: Cardiovascular: There is slightly suboptimal opacification of the main pulmonary artery, however no central or proximal segmental pulmonary embolism. The heart is normal in size. No pericardial effusion or thickening. No evidence right heart strain. There is normal three-vessel brachiocephalic anatomy without proximal stenosis. The thoracic aorta is normal in appearance. Mediastinum/Nodes: No hilar, mediastinal, or axillary adenopathy. Thyroid gland, trachea, and esophagus demonstrate no significant findings. Lungs/Pleura: Extensive multifocal patchy airspace opacities are seen throughout both lungs. No pneumothorax or pleural effusion. Upper Abdomen: No acute abnormalities present in the visualized portions of the upper abdomen. Musculoskeletal: No chest wall abnormality. No acute or significant osseous findings. Review of the MIP images confirms the above findings. IMPRESSION: Slightly suboptimal opacification of the main pulmonary artery, however no central or proximal segmental pulmonary embolism. Extensive multifocal airspace opacities, consistent with viral multifocal pneumonia. Electronically Signed   By: Jonna Clark M.D.   On: 10/12/2019 18:11   DG Chest Portable 1 View  Result Date: 10/12/2019 CLINICAL DATA:  Shortness of breath.  COVID-19 positive. EXAM: PORTABLE CHEST 1 VIEW COMPARISON:  October 09, 2019 FINDINGS: Bilateral patchy pulmonary infiltrates are identified. The infiltrates are more prominent the interval, particularly on the left. No pneumothorax. No other acute abnormalities. IMPRESSION:  Bilateral pulmonary infiltrates persist. The pulmonary infiltrates are more pronounced in the interval suggesting interval worsening. At least some of the difference could be technical in nature as the technique between the 2 studies is very different. There has been definitive significant worsening since October 07, 2019. Electronically Signed   By: Gerome Sam III M.D   On: 10/12/2019 14:01   DG Chest Port 1 View  Result Date: 10/09/2019 CLINICAL DATA:  COVID positive cough and shortness of breath for a week EXAM: PORTABLE CHEST 1 VIEW COMPARISON:  10/07/2019 FINDINGS: Cardiomediastinal contours and hilar structures are stable accounting for low lung volumes, diminished since prior study and portable technique. Patchy bilateral peripheral opacities throughout the chest have developed since the previous radiograph. No sign of pleural effusion. On limited assessment no acute skeletal process. IMPRESSION: Interval development of patchy bilateral peripheral opacities throughout the chest, suspicious for multifocal pneumonia, compatible with given history of COVID-19 infection. Electronically Signed   By: Donzetta Kohut M.D.   On: 10/09/2019 11:30   DG Chest Portable 1 View  Result Date: 10/07/2019 CLINICAL DATA:  Shortness of breath, COVID-19 positive. EXAM: PORTABLE CHEST 1 VIEW COMPARISON:  October 27, 2015.  FINDINGS: The heart size and mediastinal contours are within normal limits. No pneumothorax or pleural effusion is noted. Small patchy airspace opacities are noted in both lung bases concerning for infiltrates. The visualized skeletal structures are unremarkable. IMPRESSION: Small patchy bibasilar airspace opacities are noted concerning for infiltrates. Electronically Signed   By: Lupita Raider M.D.   On: 10/07/2019 13:57     Assessment & Plan:   History of COVID-19  Cough:   Stay well hydrated  Stay active  Deep breathing exercises  May start vitamin C 2,000 mg daily, vitamin D3  2,000 IU daily, Zinc 220 mg daily, and Quercetin 500 mg twice daily  May take tylenol or fever or pain  May take mucinex twice daily    Follow up:  Follow up in 1 week - will need f/up chest xray      Ivonne Andrew, NP 10/28/2019

## 2019-10-28 NOTE — Patient Instructions (Addendum)
Covid 19 Cough:   Stay well hydrated  Stay active  Deep breathing exercises  May start vitamin C 2,000 mg daily, vitamin D3 2,000 IU daily, Zinc 220 mg daily, and Quercetin 500 mg twice daily  May take tylenol or fever or pain  May take mucinex twice daily    Follow up:  Follow up in 1 week - will need f/up chest xray

## 2019-10-28 NOTE — Assessment & Plan Note (Signed)
Cough:   Stay well hydrated  Stay active  Deep breathing exercises  May start vitamin C 2,000 mg daily, vitamin D3 2,000 IU daily, Zinc 220 mg daily, and Quercetin 500 mg twice daily  May take tylenol or fever or pain  May take mucinex twice daily    Follow up:  Follow up in 1 week - will need f/up chest xray

## 2019-11-07 ENCOUNTER — Ambulatory Visit (INDEPENDENT_AMBULATORY_CARE_PROVIDER_SITE_OTHER): Payer: Medicaid Other | Admitting: Nurse Practitioner

## 2019-11-07 VITALS — BP 134/85 | HR 72 | Temp 97.5°F | Wt 241.0 lb

## 2019-11-07 DIAGNOSIS — Z8616 Personal history of COVID-19: Secondary | ICD-10-CM

## 2019-11-07 NOTE — Assessment & Plan Note (Signed)
Stay well hydrated  Stay active  Deep breathing exercises  May start vitamin C 2,000 mg daily, vitamin D3 2,000 IU daily, Zinc 220 mg daily, and Quercetin 500 mg twice daily  May take tylenol or fever or pain  May take mucinex twice daily  Will order chest x ray   Follow up:  Follow up as needed  

## 2019-11-07 NOTE — Progress Notes (Signed)
@Patient  ID: , male    DOB: 1985/12/21, 34 y.o.   MRN: [id number]  Chief Complaint  Patient presents with  . Follow-up    No Sx, finished predisone from last OV    Referring provider: [id number], NP   34 year old male with history of migraines, seizures. Diagnosed with Covid on 10/07/2019.   HPI  Patient presents today for post COVID care clinic visit.  He was diagnosed with Covid on 10/07/2019.  He did receive monoclonal antibody infusion on 10/09/2019.  He was prescribed a round of prednisone on 10/12/2019.  Patient states that since his last visit here he is completely better.  Denies any significant shortness of breath at this point.  Denies any cough. Denies f/c/s, n/v/d, hemoptysis, PND, chest pain or edema.        No Known Allergies  Immunization History  Administered Date(s) Administered  . Tdap 05/26/2015    Past Medical History:  Diagnosis Date  . COVID-19   . Migraine   . Seizures (HCC)    pt last seizure appx 14 years ago    Tobacco History: Social History   Tobacco Use  Smoking Status Never Smoker  Smokeless Tobacco Never Used   Counseling given: Yes   Outpatient Encounter Medications as of 11/07/2019  Medication Sig  . acetaminophen (TYLENOL) 500 MG tablet Take 1,000 mg by mouth every 6 (six) hours as needed for headache. (Patient not taking: Reported on 10/28/2019)  . cetirizine (ZYRTEC) 10 MG tablet Take 10 mg by mouth daily as needed for allergies. Reported on 05/26/2015 (Patient not taking: Reported on 10/28/2019)  . cyclobenzaprine (FLEXERIL) 10 MG tablet Take 1 tablet (10 mg total) by mouth 2 (two) times daily as needed for muscle spasms. (Patient not taking: Reported on 10/28/2019)  . ondansetron (ZOFRAN ODT) 4 MG disintegrating tablet Take 1 tablet (4 mg total) by mouth every 6 (six) hours as needed for nausea or vomiting. (Patient not taking: Reported on 10/28/2019)  . Phenyleph-CPM-DM-APAP (TYLENOL COLD HEAD CONGESTION PO)  Take 1 tablet by mouth daily as needed (headache). (Patient not taking: Reported on 10/28/2019)   No facility-administered encounter medications on file as of 11/07/2019.     Review of Systems  Review of Systems  Constitutional: Negative.  Negative for fatigue and fever.  HENT: Negative.   Respiratory: Negative for cough and shortness of breath.   Cardiovascular: Negative.  Negative for chest pain, palpitations and leg swelling.  Gastrointestinal: Negative.   Allergic/Immunologic: Negative.   Neurological: Negative.   Psychiatric/Behavioral: Negative.        Physical Exam  BP 134/85 (BP Location: Left Arm)   Pulse 72   Temp (!) 97.5 F (36.4 C)   Wt 241 lb (109.3 kg)   SpO2 96%   BMI 41.37 kg/m   Wt Readings from Last 5 Encounters:  11/07/19 241 lb (109.3 kg)  10/28/19 239 lb (108.4 kg)  10/12/19 247 lb (112 kg)  10/09/19 247 lb (112 kg)  05/26/15 231 lb (104.8 kg)     Physical Exam Vitals and nursing note reviewed.  Constitutional:      General: He is not in acute distress.    Appearance: He is well-developed.  Cardiovascular:     Rate and Rhythm: Normal rate and regular rhythm.  Pulmonary:     Effort: Pulmonary effort is normal.     Breath sounds: Normal breath sounds.  Musculoskeletal:     Right lower leg: No edema.     Left  lower leg: No edema.  Skin:    General: Skin is warm and dry.  Neurological:     Mental Status: He is alert and oriented to person, place, and time.  Psychiatric:        Mood and Affect: Mood normal.        Behavior: Behavior normal.       Imaging: CT Angio Chest PE W and/or Wo Contrast  Result Date: 10/12/2019 CLINICAL DATA:  COVID positive shortness of breath EXAM: CT ANGIOGRAPHY CHEST WITH CONTRAST TECHNIQUE: Multidetector CT imaging of the chest was performed using the standard protocol during bolus administration of intravenous contrast. Multiplanar CT image reconstructions and MIPs were obtained to evaluate the vascular  anatomy. CONTRAST:  64mL OMNIPAQUE IOHEXOL 350 MG/ML SOLN COMPARISON:  None. FINDINGS: Cardiovascular: There is slightly suboptimal opacification of the main pulmonary artery, however no central or proximal segmental pulmonary embolism. The heart is normal in size. No pericardial effusion or thickening. No evidence right heart strain. There is normal three-vessel brachiocephalic anatomy without proximal stenosis. The thoracic aorta is normal in appearance. Mediastinum/Nodes: No hilar, mediastinal, or axillary adenopathy. Thyroid gland, trachea, and esophagus demonstrate no significant findings. Lungs/Pleura: Extensive multifocal patchy airspace opacities are seen throughout both lungs. No pneumothorax or pleural effusion. Upper Abdomen: No acute abnormalities present in the visualized portions of the upper abdomen. Musculoskeletal: No chest wall abnormality. No acute or significant osseous findings. Review of the MIP images confirms the above findings. IMPRESSION: Slightly suboptimal opacification of the main pulmonary artery, however no central or proximal segmental pulmonary embolism. Extensive multifocal airspace opacities, consistent with viral multifocal pneumonia. Electronically Signed   By: Jonna Clark M.D.   On: 10/12/2019 18:11   DG Chest Portable 1 View  Result Date: 10/12/2019 CLINICAL DATA:  Shortness of breath.  COVID-19 positive. EXAM: PORTABLE CHEST 1 VIEW COMPARISON:  October 09, 2019 FINDINGS: Bilateral patchy pulmonary infiltrates are identified. The infiltrates are more prominent the interval, particularly on the left. No pneumothorax. No other acute abnormalities. IMPRESSION: Bilateral pulmonary infiltrates persist. The pulmonary infiltrates are more pronounced in the interval suggesting interval worsening. At least some of the difference could be technical in nature as the technique between the 2 studies is very different. There has been definitive significant worsening since October 07, 2019. Electronically Signed   By: Gerome Sam III M.D   On: 10/12/2019 14:01   DG Chest Port 1 View  Result Date: 10/09/2019 CLINICAL DATA:  COVID positive cough and shortness of breath for a week EXAM: PORTABLE CHEST 1 VIEW COMPARISON:  10/07/2019 FINDINGS: Cardiomediastinal contours and hilar structures are stable accounting for low lung volumes, diminished since prior study and portable technique. Patchy bilateral peripheral opacities throughout the chest have developed since the previous radiograph. No sign of pleural effusion. On limited assessment no acute skeletal process. IMPRESSION: Interval development of patchy bilateral peripheral opacities throughout the chest, suspicious for multifocal pneumonia, compatible with given history of COVID-19 infection. Electronically Signed   By: Donzetta Kohut M.D.   On: 10/09/2019 11:30     Assessment & Plan:   History of COVID-19 Stay well hydrated  Stay active  Deep breathing exercises  May start vitamin C 2,000 mg daily, vitamin D3 2,000 IU daily, Zinc 220 mg daily, and Quercetin 500 mg twice daily  May take tylenol or fever or pain  May take mucinex twice daily  Will order chest xray   Follow up:  Follow up as needed  Ivonne Andrew, NP 11/07/2019

## 2019-11-07 NOTE — Patient Instructions (Addendum)
History of COVID-19:   Stay well hydrated  Stay active  Deep breathing exercises  May start vitamin C 2,000 mg daily, vitamin D3 2,000 IU daily, Zinc 220 mg daily, and Quercetin 500 mg twice daily  May take tylenol or fever or pain  May take mucinex twice daily  Will order chest xray   Follow up:  Follow up as needed

## 2020-06-16 ENCOUNTER — Ambulatory Visit (INDEPENDENT_AMBULATORY_CARE_PROVIDER_SITE_OTHER): Payer: Medicare Other | Admitting: Neurology

## 2020-06-16 ENCOUNTER — Encounter: Payer: Self-pay | Admitting: Neurology

## 2020-06-16 VITALS — BP 134/95 | HR 70 | Ht 67.0 in | Wt 252.0 lb

## 2020-06-16 DIAGNOSIS — R519 Headache, unspecified: Secondary | ICD-10-CM

## 2020-06-16 DIAGNOSIS — R0683 Snoring: Secondary | ICD-10-CM

## 2020-06-16 DIAGNOSIS — R351 Nocturia: Secondary | ICD-10-CM

## 2020-06-16 DIAGNOSIS — G43019 Migraine without aura, intractable, without status migrainosus: Secondary | ICD-10-CM | POA: Diagnosis not present

## 2020-06-16 DIAGNOSIS — R0681 Apnea, not elsewhere classified: Secondary | ICD-10-CM

## 2020-06-16 DIAGNOSIS — Z789 Other specified health status: Secondary | ICD-10-CM

## 2020-06-16 DIAGNOSIS — R030 Elevated blood-pressure reading, without diagnosis of hypertension: Secondary | ICD-10-CM

## 2020-06-16 MED ORDER — TOPIRAMATE 50 MG PO TABS: 50.0000 mg | ORAL_TABLET | Freq: Two times a day (BID) | ORAL | 3 refills | Status: DC

## 2020-06-16 NOTE — Progress Notes (Signed)
Subjective:    Patient ID: David Stevenson is a 35 y.o. male.  HPI     David Foley, MD, PhD [facility] [address] [address] [place], [place] [address]  Dear Dr. Thornell Mule,   I saw your patient, David Stevenson, upon your kind request in my sleep clinic today for initial consultation of his sleep disorder in particular, recurrent nocturnal headaches and also concern for migraine headaches.  The patient is unaccompanied today.  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  He has previously been on different over-the-counter medications, most recently he has been taking Excedrin fairly frequently, after your visit he cut it back to taking 2 pills every other day.  He has nearly daily headaches, every other day it seems like a migraine with association of photophobia, sonophobia, nausea and sometimes vomiting.  It helps to lay down in a dark room, it has to be quiet.  He has no visual aura or visual symptoms such as blurry vision, double vision or loss of vision.  He has no neurological accompaniments such as one-sided weakness or numbness or tingling or droopy face or slurring of speech.  He had a brain MRI in the past as he recalls but not sure when it was done. A previous primary care physician tried him on Topamax 50 mg, he did not think it helped but did not go higher on the dose.  He also tried Maxalt recently through your office when he saw the nurse practitioner about a month ago and reports that it did not help.  He was supposed to have blood work done  through your office but reports that he has not had any blood work.  In the past few months he has had nearly daily headaches.  He wakes up with a headache in the middle of the night or first thing in the morning.  His bedtime is late, around 1:30 AM or 2 AM and he may sleep till 1 PM.  He reports that his 26-year-old does not sleep well and wakes up around 11 PM and stays up for an hour or 2.  He lives with his wife and 4 children, ages 30, 65, 49 and 48-year-old.  He does not work, he is on disability.  He is a non-smoker and does not drink alcohol, he drinks caffeine, he has cut back after he spoke with you.  He drinks 1 cup of coffee in the morning and a 20 ounce soda maybe every other day at this time.   I reviewed your telemedicine office note from 04/13/2020.  His Epworth sleepiness score is 12 out of 24. He had a head CT without contrast on 11/18/2013 and I reviewed the results:  IMPRESSION:  Normal head CT with no acute intracranial abnormality identified.    In addition, I was able to review with the images through the PACS system.  His Past Medical History Is Significant For: Past Medical History:  Diagnosis Date  . COVID-19   . Migraine   . Seizures (HCC)    pt last seizure appx 14 years ago    His  Past Surgical History Is Significant For: Past Surgical History:  Procedure Laterality Date  . ABDOMINAL SURGERY      His Family History Is Significant For: Family History  Problem Relation Age of Onset  . Seizures Father     His Social History Is Significant For: Social History   Socioeconomic History  . Marital status: Single    Spouse name: Not on file  . Number of children: Not on file  . Years of education: Not on file  . Highest education level: Not on file  Occupational History  . Not on file  Tobacco Use  . Smoking status: Never Smoker  . Smokeless tobacco: Never Used  Vaping Use  . Vaping Use: Never used  Substance and Sexual Activity  . Alcohol use: No  .  Drug use: No  . Sexual activity: Yes  Other Topics Concern  . Not on file  Social History Narrative  . Not on file   Social Determinants of Health   Financial Resource Strain: Not on file  Food Insecurity: Not on file  Transportation Needs: Not on file  Physical Activity: Not on file  Stress: Not on file  Social Connections: Not on file    His Allergies Are:  No Known Allergies:   His Current Medications Are:  Outpatient Encounter Medications as of 06/16/2020  Medication Sig  . acetaminophen (TYLENOL) 500 MG tablet Take 1,000 mg by mouth every 6 (six) hours as needed for headache.  Marland Kitchen aspirin-acetaminophen-caffeine (EXCEDRIN EXTRA STRENGTH) 250-250-65 MG tablet 2 tablets  . topiramate (TOPAMAX) 50 MG tablet Take 1 tablet (50 mg total) by mouth 2 (two) times daily. Please follow titration instructions provided separately.  . [DISCONTINUED] cetirizine (ZYRTEC) 10 MG tablet Take 10 mg by mouth daily as needed for allergies. Reported on 05/26/2015 (Patient not taking: Reported on 10/28/2019)  . [DISCONTINUED] cyclobenzaprine (FLEXERIL) 10 MG tablet Take 1 tablet (10 mg total) by mouth 2 (two) times daily as needed for muscle spasms. (Patient not taking: Reported on 10/28/2019)  . [DISCONTINUED] ondansetron (ZOFRAN ODT) 4 MG disintegrating tablet Take 1 tablet (4 mg total) by mouth every 6 (six) hours as needed for nausea or vomiting. (Patient not taking: Reported on 10/28/2019)  . [DISCONTINUED] Phenyleph-CPM-DM-APAP (TYLENOL COLD HEAD CONGESTION PO) Take 1 tablet by mouth daily as needed (headache). (Patient not taking: Reported on 10/28/2019)   No facility-administered encounter medications on file as of 06/16/2020.  :   Review of Systems:  Out of a complete 14 point review of systems, all are reviewed and negative with the exception of these symptoms as listed below:  Review of Systems  Neurological:       Here for consult on worsening Migraines/h/a. Pt reports a long hx of migraines  and has tried most OTC meds.  Pt reports Excedrin migraine helps some. Pt sts migraines are every other day and reports light/noise sensitivity along with N/V.     Objective:  Neurological Exam  Physical Exam Physical Examination:   Vitals:   06/16/20 0740  BP: (!) 134/95  Pulse: 70    General Examination: The patient is a very pleasant 35 y.o. male in no acute distress. He appears well-developed and well-nourished and well groomed.   HEENT: Normocephalic, atraumatic, pupils are equal, round and reactive to light and accommodation. Funduscopic exam is normal with sharp disc margins noted. Extraocular tracking is good without limitation to gaze excursion or nystagmus noted. Normal smooth pursuit is noted. Hearing is grossly intact. Face is  symmetric with normal facial animation and normal facial sensation. Speech is clear with no dysarthria noted. There is no hypophonia. There is no lip, neck/head, jaw or voice tremor. Neck is supple with full range of passive and active motion. There are no carotid bruits on auscultation. Oropharynx exam reveals: mild mouth dryness, adequate dental hygiene and moderate airway crowding, due to small airway entry, tonsils on the smaller side, Mallampati class I.  Neck circumference of 17 inches.  He has a minimal overbite.  Tongue protrudes centrally and palate elevates symmetrically.  Chest: Clear to auscultation without wheezing, rhonchi or crackles noted.  Heart: S1+S2+0, regular and normal without murmurs, rubs or gallops noted.   Abdomen: Soft, non-tender and non-distended.  Extremities: There is no pitting edema in the distal lower extremities bilaterally. Pedal pulses are intact.  Skin: Warm and dry without trophic changes noted.  Musculoskeletal: exam reveals no obvious joint deformities, tenderness or joint swelling or erythema.   Neurologically:  Mental status: The patient is awake, alert and oriented in all 4 spheres. His immediate and  remote memory, attention, language skills and fund of knowledge are appropriate. There is no evidence of aphasia, agnosia, apraxia or anomia. Speech is clear with normal prosody and enunciation. Thought process is linear. Mood is normal and affect is normal.  Cranial nerves II - XII are as described above under HEENT exam. In addition: shoulder shrug is normal with equal shoulder height noted. Motor exam: Normal bulk, strength and tone is noted. There is no drift, tremor or rebound. Romberg is negative. Reflexes are 2+ throughout. Babinski: Toes are flexor bilaterally. Fine motor skills and coordination: intact with normal finger taps, normal hand movements, normal rapid alternating patting, normal foot taps and normal foot agility.  Cerebellar testing: No dysmetria or intention tremor on finger to nose testing. Heel to shin is unremarkable bilaterally. There is no truncal or gait ataxia.  Sensory exam: intact to light touch, vibration, temperature sense in the upper and lower extremities.  Gait, station and balance: He stands easily. No veering to one side is noted. No leaning to one side is noted. Posture is age-appropriate and stance is narrow based. Gait shows normal stride length and normal pace. No problems turning are noted. Tandem walk is unremarkable.   Assessment and Plan:   In summary, David Stevenson is a very pleasant 35 y.o.-year old male  with an underlying medical history of learning difficulty (reading, per pt), headaches since elementary school, history of COVID in September 2021 with status post monoclonal antibody infusion treatment as well as prednisone treatment, history of seizures in childhood (he was born with epilepsy, he states, no Sz in years), and obesity, who presents for evaluation of his recurrent headaches and sleep disturbance.  While his history is supportive of an underlying diagnosis of migraine headaches, his current headache presentation may be more of a combination  of different headache syndromes including migrainous headaches, caffeine overuse headaches, with rebound headaches, medication overuse headaches with rebound component, sleep disturbance when underlying obstructive sleep apnea a possible contributor.  We talked about these different possibilities.  For migraine prevention, I would like for him to restart Topamax but titrate this higher to 100 mg and eventually 200 mg if possible.  We talked about expectations and limitations of preventatives.  We may consider something different altogether.  He was supposed to have blood work through your office, he is advised to talk to you about scheduling this.  He is furthermore advised to continue  to work on lifestyle modification.  He is advised to stay well-hydrated, limit his caffeine, and use Excedrin Migraine very sparingly and cautiously.  We may consider a prescription acute migraine medication but for now I recommend that we proceed with diagnostic testing in the form of sleep study.  If he is diagnosed with obstructive sleep apnea, he is advised to consider CPAP or AutoPap therapy.  We talked about other treatment options including surgical and dental treatment option and weight management for sleep apnea.  We will plan to follow-up soon after testing.  We will keep him posted as to his sleep study results as well.  He is advised to call us with any interim questions or concerns.  His neurological exam is nonfocal and he was reassured in that regard.  I answered all his questions today and he was in agreement with the plan.   Thank you very much for allowing me to participate in the care of this nice patient. If I can be of any further assistance to you please do not hesitate to call me at [phone].  Sincerely,   David FoleySaima Lakelyn Straus, MD, PhD

## 2020-06-16 NOTE — Patient Instructions (Signed)
It was nice to meet you today!   For migraine prevention, we will start Topamax 50 mg: Take half a pill each bedtime for one week, then one pill at bedtime daily for one week, then 1-1/2 pills at bedtime daily for one week, then 2 pills at bedtime daily thereafter. Common side effects reported are: Sedation, sleepiness, tingling, change in taste especially with carbonated drinks, and rare side effects are glaucoma, kidney stones, and problems with thinking including word finding difficulties.  We may consider a different medication for your as needed use for migraine attacks, for now, please use Excedrin very sparingly.  Reduce your caffeine intake to limit yourself to 1 or 2 servings per day which includes coffee and soda.  Please also remember that Excedrin contains caffeine.  Based on your symptoms and your exam I believe you are at risk for obstructive sleep apnea (aka OSA), and I think we should proceed with a sleep study to determine whether you do or do not have OSA and how severe it is. Even, if you have mild OSA, I may want you to consider treatment with CPAP, as treatment of even borderline or mild sleep apnea can result and improvement of symptoms such as sleep disruption, daytime sleepiness, nighttime bathroom breaks, restless leg symptoms, improvement of headache syndromes, even improved mood disorder.   As explained, an attended sleep study meaning you get to stay overnight in the sleep lab, lets [place] monitor sleep-related behaviors such as sleep talking and leg movements in sleep, in addition to monitoring for sleep apnea.  A home sleep test is a screening tool for sleep apnea only, and unfortunately does not help with any other sleep-related diagnoses.  Please remember, the long-term risks and ramifications of untreated moderate to severe obstructive sleep apnea are: increased Cardiovascular disease, including congestive heart failure, stroke, difficult to control hypertension, treatment  resistant obesity, arrhythmias, especially irregular heartbeat commonly known as A. Fib. (atrial fibrillation); even type 2 diabetes has been linked to untreated OSA.   Sleep apnea can cause disruption of sleep and sleep deprivation in most cases, which, in turn, can cause recurrent headaches, problems with memory, mood, concentration, focus, and vigilance. Most people with untreated sleep apnea report excessive daytime sleepiness, which can affect their ability to drive. Please do not drive if you feel sleepy. Patients with sleep apnea can also develop difficulty initiating and maintaining sleep (aka insomnia).   Having sleep apnea may increase your risk for other sleep disorders, including involuntary behaviors sleep such as sleep terrors, sleep talking, sleepwalking.    Having sleep apnea can also increase your risk for restless leg syndrome and leg movements at night.   Please note that untreated obstructive sleep apnea may carry additional perioperative morbidity. Patients with significant obstructive sleep apnea (typically, in the moderate to severe degree) should receive, if possible, perioperative PAP (positive airway pressure) therapy and the surgeons and particularly the anesthesiologists should be informed of the diagnosis and the severity of the sleep disordered breathing.   I will likely see you back after your sleep study to go over the test results and where to go from there. We will call you after your sleep study to advise about the results (most likely, you will hear from [facility], my nurse) and to set up an appointment at the time, as necessary.    Our sleep lab administrative assistant will call you to schedule your sleep study and give you further instructions, regarding the check in process for the  sleep study, arrival time, what to bring, when you can expect to leave after the study, etc., and to answer any other logistical questions you may have. If you don't hear back from her by  about 2 weeks from now, please feel free to call her direct line at [phone] or you can call our general clinic number, or email [place] through My Chart.

## 2020-06-25 ENCOUNTER — Institutional Professional Consult (permissible substitution): Payer: Medicare Other | Admitting: Neurology

## 2020-06-29 ENCOUNTER — Ambulatory Visit (INDEPENDENT_AMBULATORY_CARE_PROVIDER_SITE_OTHER): Payer: Medicare Other | Admitting: Neurology

## 2020-06-29 DIAGNOSIS — G4733 Obstructive sleep apnea (adult) (pediatric): Secondary | ICD-10-CM | POA: Diagnosis not present

## 2020-06-29 DIAGNOSIS — R030 Elevated blood-pressure reading, without diagnosis of hypertension: Secondary | ICD-10-CM

## 2020-06-29 DIAGNOSIS — R0683 Snoring: Secondary | ICD-10-CM

## 2020-06-29 DIAGNOSIS — G43019 Migraine without aura, intractable, without status migrainosus: Secondary | ICD-10-CM

## 2020-06-29 DIAGNOSIS — Z789 Other specified health status: Secondary | ICD-10-CM

## 2020-06-29 DIAGNOSIS — R0681 Apnea, not elsewhere classified: Secondary | ICD-10-CM

## 2020-06-29 DIAGNOSIS — R519 Headache, unspecified: Secondary | ICD-10-CM

## 2020-06-29 DIAGNOSIS — R351 Nocturia: Secondary | ICD-10-CM

## 2020-06-30 NOTE — Addendum Note (Signed)
Addended by: Huston Foley on: 06/30/2020 06:10 PM   Modules accepted: Orders

## 2020-06-30 NOTE — Progress Notes (Signed)
See procedure note.

## 2020-06-30 NOTE — Procedures (Signed)
[facility]  HOME SLEEP TEST (Watch PAT) REPORT  STUDY DATE: 06/29/20  DOB: 1985/07/19  MRN: [id number]  ORDERING CLINICIAN: Huston Foley, MD, PhD   REFERRING CLINICIAN: Charlane Ferretti, DO  CLINICAL INFORMATION/HISTORY: 35 year old man with a history of learning difficulty, recurrent headaches, history of COVID in September 2021 with status post monoclonal antibody infusion treatment as well as prednisone treatment, history of seizures in childhood, who reports migraines. He has woken up with headaches and reports excessive daytime sleepiness.    Epworth sleepiness score: 12/24.  BMI: 39.47 kg/m  Neck Circumference: 17"  FINDINGS:   Sleep Summary:   Total Recording Time (hours, min): 6 H 7 min  Total Sleep Time (hours, min):  5 H 6 min   Percent REM (%):    29.52%   Respiratory Indices:   Calculated pAHI (per hour): 44.1/hour        REM pAHI: 57.9/hour      NREM pAHI: 39.9/hour  Oxygen Saturation Statistics:    Oxygen Saturation (%) Mean: 94%  Minimum oxygen saturation (%): 80%  O2 Saturation Range (%): 80-100%   O2 Saturation (minutes) <=88%: 3.3 min   Pulse Rate Statistics:   Pulse Mean (bpm): 61/min    Pulse Range (40-107/min)   IMPRESSION: OSA (obstructive sleep apnea), severe  RECOMMENDATION:  This home sleep test demonstrates severe obstructive sleep apnea with a total AHI of 44.1/hour and O2 nadir of 80%. Snoring was detected, ranging from mild to loud. Treatment with positive airway pressure is recommended. The patient will be advised to proceed with an autoPAP titration/trial at home for now. A full night titration study may be considered to optimize treatment settings, if needed down the road. Please note that untreated obstructive sleep apnea may carry additional perioperative morbidity. Patients with significant obstructive sleep apnea should receive perioperative PAP therapy and the surgeons and particularly the anesthesiologist  should be informed of the diagnosis and the severity of the sleep disordered breathing. The patient should be cautioned not to drive, work at heights, or operate dangerous or heavy equipment when tired or sleepy. Review and reiteration of good sleep hygiene measures should be pursued with any patient. Other causes of the patient's symptoms, including circadian rhythm disturbances, an underlying mood disorder, medication effect and/or an underlying medical problem cannot be ruled out based on this test. Clinical correlation is recommended. The patient and his referring provider will be notified of the test results. The patient will be seen in follow up in sleep clinic at [facility].  I certify that I have reviewed the raw data recording prior to the issuance of this report in accordance with the standards of the American Academy of Sleep Medicine (AASM).  INTERPRETING PHYSICIAN:  Huston Foley, MD, PhD  Board Certified in Neurology and Sleep Medicine  [facility] [address], [address] [place], [place] [address] [phone]   Sleep Summary  Oxygen Saturation Statistics   Start Study Time: End Study Time: Total Recording Time:         10:10:14 PM 4:17:48 AM 6 h, 7 min  Total Sleep Time % REM of Sleep Time:  5 h, 6 min  29.5    Mean: 94 Minimum: 80 Maximum: 100  Mean of Desaturations Nadirs (%):   91  Oxygen Desatur. %:  4-9 10-20 >20 Total  Events Number Total   105  28 78.9 21.1  0 0.0  133 100.0  Oxygen Saturation: <90 <=88 <85 <80 <70  Duration (minutes): Sleep %  5.3 1.7 3.3 0.8 1.1 0.3 0.0 0.0 0.0 0.0     Respiratory Indices      Total Events REM NREM All Night  pRDI: pAHI 3%: ODI 4%: pAHIc 3%: % CSR: pAHI 4%:  209  199 133   25 0.0 157 61.7 57.9 41.8 6.9 41.6 39.9 25.7 5.2 46.3 44.1 29.5 5.6 0.0 34.8       Pulse Rate Statistics during Sleep (BPM)      Mean: 61 Minimum: 40 Maximum: 107        Body Position Statistics  Position  Supine Prone Right Left Non-Supine  Sleep (min) 123.5 142.0 0.0 41.1 183.1  Sleep % 40.3 46.3 0.0 13.4 59.7  pRDI 70.1 24.2 N/A 42.5 28.9  pAHI 3% 67.5 23.2 N/A 38.0 26.9  ODI 4% 52.8 10.3 N/A 18.2 12.3     Snoring Statistics Snoring Level (dB) >40 >50 >60 >70 >80 >Threshold (45)  Sleep (min) 177.4 35.3 2.1 0.0 0.0 74.9  Sleep % 57.9 11.5 0.7 0.0 0.0 24.4    Mean: 43 dB

## 2020-06-30 NOTE — Progress Notes (Signed)
Patient referred by Dr. Thornell Mule for recurrent HAs/migraines and sleep d/o, seen by me on 06/16/20, patient had a HST on 06/29/20.    Please call and notify the patient that the recent home sleep test showed obstructive sleep apnea in the severe range. I recommend treatment in the form of autoPAP, which means, that we don't have to bring him in for a sleep study with CPAP, but will let him start using a so called autoPAP machine at home, which is a CPAP-like machine with self-adjusting pressures. We will send the order to a local DME company (of his choice, or as per insurance requirement). The DME representative will fit him with a mask, educate him on how to use the machine, how to put the mask on, etc. I have placed an order in the chart. Please send the order, talk to patient, send report to referring MD. We will need a FU in sleep clinic for 10 weeks post-PAP set up, please arrange that with me or one of our NPs. Also reinforce the need for compliance with treatment. Thanks,   Huston Foley, MD, PhD [facility])

## 2020-07-01 ENCOUNTER — Telehealth: Payer: Self-pay

## 2020-07-01 NOTE — Telephone Encounter (Signed)
-----   Message from Huston Foley, MD sent at 06/30/2020  6:10 PM EDT ----- Patient referred by Dr. Thornell Mule for recurrent HAs/migraines and sleep d/o, seen by me on 06/16/20, patient had a HST on 06/29/20.    Please call and notify the patient that the recent home sleep test showed obstructive sleep apnea in the severe range. I recommend treatment in the form of autoPAP, which means, that we don't have to bring him in for a sleep study with CPAP, but will let him start using a so called autoPAP machine at home, which is a CPAP-like machine with self-adjusting pressures. We will send the order to a local DME company (of his choice, or as per insurance requirement). The DME representative will fit him with a mask, educate him on how to use the machine, how to put the mask on, etc. I have placed an order in the chart. Please send the order, talk to patient, send report to referring MD. We will need a FU in sleep clinic for 10 weeks post-PAP set up, please arrange that with me or one of our NPs. Also reinforce the need for compliance with treatment. Thanks,   Huston Foley, MD, PhD [facility])

## 2020-07-01 NOTE — Telephone Encounter (Signed)
I called pt. I advised pt that Dr. Frances Furbish reviewed their sleep study results and found that pt severe osa. Dr. Frances Furbish recommends that pt start auto-pap therapy with pressure setting of 7-14 . I reviewed PAP compliance expectations with the pt. Pt is agreeable to starting an auto-PAP. I advised pt that an order will be sent to a DME, [facility], and [employer] will call the pt within about one week after they file with the pt's insurance. [facility] will show the pt how to use the machine, fit for masks, and troubleshoot the auto-PAP if needed. A follow up appt was made for insurance purposes with Dr. Frances Furbish on 09/17/20 200 pm. Pt verbalized understanding to arrive 15 minutes early and bring their auto-PAP. A letter with all of this information in it will be mailed to the pt as a reminder. I verified with the pt that the address we have on file is correct. Pt verbalized understanding of results. Pt had no questions at this time but was encouraged to call back if questions arise. I have sent the order to [employer] and have received confirmation that they have received the order.

## 2020-09-16 ENCOUNTER — Telehealth: Payer: Self-pay | Admitting: *Deleted

## 2020-09-16 NOTE — Telephone Encounter (Signed)
I called pt and he is not using machine, too much pressure, not comfortable.  Will bring machine resmed, (has card).

## 2020-09-16 NOTE — Telephone Encounter (Signed)
Attempted to call pt and was not able to LVM (full).  Need to know if received cpap?

## 2020-09-17 ENCOUNTER — Encounter: Payer: Self-pay | Admitting: Neurology

## 2020-09-17 ENCOUNTER — Ambulatory Visit (INDEPENDENT_AMBULATORY_CARE_PROVIDER_SITE_OTHER): Payer: Medicare Other | Admitting: Neurology

## 2020-09-17 VITALS — BP 146/93 | HR 85 | Ht 68.0 in | Wt 248.0 lb

## 2020-09-17 DIAGNOSIS — R519 Headache, unspecified: Secondary | ICD-10-CM

## 2020-09-17 DIAGNOSIS — G4733 Obstructive sleep apnea (adult) (pediatric): Secondary | ICD-10-CM

## 2020-09-17 DIAGNOSIS — G43019 Migraine without aura, intractable, without status migrainosus: Secondary | ICD-10-CM

## 2020-09-17 DIAGNOSIS — R351 Nocturia: Secondary | ICD-10-CM

## 2020-09-17 DIAGNOSIS — Z789 Other specified health status: Secondary | ICD-10-CM

## 2020-09-17 DIAGNOSIS — R0683 Snoring: Secondary | ICD-10-CM

## 2020-09-17 DIAGNOSIS — R0681 Apnea, not elsewhere classified: Secondary | ICD-10-CM

## 2020-09-17 MED ORDER — TOPIRAMATE 50 MG PO TABS: ORAL_TABLET | ORAL | 5 refills | Status: DC

## 2020-09-17 NOTE — Patient Instructions (Signed)
It was nice to see you again today.  I am sorry to hear that you have had trouble adjusting to your AutoPap machine.  As discussed, we will reduce your pressure setting, please restart using her machine, I will change her setting from 7 cm to 14 cm currently to a lower setting: 4 cm to 11 cm.  I will write for a new mask, we will have your DME company reach out.  We will also request insurance authorization to bring you in for sleep study for CPAP titration.  We will be in touch by phone to arrange for your test in the sleep lab.  As discussed, we will increase your Topamax, take 1 pill in the morning and 2 at bedtime.  Tingling or numbness is a common side effect, as long as you can tolerate these sensations, this is not a sign of a sinister side effect.  We will plan a follow-up after your next sleep study.

## 2020-09-17 NOTE — Progress Notes (Signed)
Message sent to [facility] for new DME orders.

## 2020-09-17 NOTE — Progress Notes (Signed)
Subjective:    Patient ID: David Stevenson is a 35 y.o. male.  HPI    Interim history:   Mr. 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  The patient is unaccompanied today.  I first met him at the request of his primary care physician on 06/16/2020, at which time he reported a prior diagnosis of migraine headaches.  He also had nocturnal headaches and concern for sleep apnea.  He was advised to proceed with headache prevention in the form of Topamax.  He had been on it before but had stopped it.  He was advised to titrate to up to 100 mg daily.  In addition, he was advised to proceed with a sleep study.  He had a home sleep test on 06/29/2020 which indicated severe obstructive sleep apnea with an AHI of 44.1/h, O2 nadir 80%.  He was advised to start AutoPap therapy.  His set up date was 08/03/2020. He has a Pharmacist, community  Today, 09/17/2020: He reports that he could not tolerate his AutoPap, pressure felt too high and the mask was uncomfortable.  He started off with the nasal pillows but is a mouth breather.  He then switched to a full facemask, by [employer], [place] but feels that this is worse.  He is willing to try treatment again at a lower pressure.  He has been using Topamax, he has had some numbness around the lips but otherwise tolerates it.  He still has recurrent headaches especially at night.  He is willing to increase the Topamax.  We also talked about bringing him in for proper titration, he is willing to pursue this as well.  The patient's allergies, current medications,  family history, past medical history, past social history, past surgical history and problem list were reviewed and updated as appropriate.   Previously:   06/16/20: (He) reports migraines since he was in elementary school.  He has previously been on different over-the-counter medications, most recently he has been taking Excedrin fairly frequently, after your visit he cut it back to taking 2 pills every other day.  He has nearly daily headaches, every other day it seems like a migraine with association of photophobia, sonophobia, nausea and sometimes vomiting.  It helps to lay down in a dark room, it has to be quiet.  He has no visual aura or visual symptoms such as blurry vision, double vision or loss of vision.  He has no neurological accompaniments such as one-sided weakness or numbness or tingling or droopy face or slurring of speech.  He had a brain MRI in the past as he recalls but not sure when it was done. A previous primary care physician tried him on Topamax 50 mg, he did not think it helped but did not go higher on the dose.  He also tried Maxalt recently through your office when he saw the nurse practitioner about a month ago and reports that it did not help.  He was supposed to have blood work done through your office but reports that he has not had any blood work.  In the past few months he has had nearly daily headaches.  He wakes up with a headache in the middle of the night or first thing in the morning.  His bedtime is late, around 1:30 AM or 2 AM and he may sleep till 1 PM.  He reports that his 14-year-old does not sleep well and wakes up around 11 PM and stays up for an hour or 2.  He lives with his wife and 4 children, ages 72, 39, 68 and 59-year-old.  He does not work, he is on disability.  He is a non-smoker and does not drink alcohol, he drinks caffeine, he has cut back after he spoke with you.  He drinks 1 cup of coffee in the morning and a 20 ounce soda maybe every other day at this time.     I reviewed your telemedicine office note from 04/13/2020.  His Epworth sleepiness score is 12 out of 24. He had a head CT without contrast on 11/18/2013 and I reviewed the results:  IMPRESSION:  Normal head CT with no acute intracranial abnormality identified.     In addition, I was able to review with the images through the PACS system.   His Past Medical History Is Significant For: Past Medical History:  Diagnosis Date   COVID-19    Migraine    Seizures ([place])    pt last seizure appx 14 years ago    His Past Surgical History Is Significant For: Past Surgical History:  Procedure Laterality Date   ABDOMINAL SURGERY      His Family History Is Significant For: Family History  Problem Relation Age of Onset   Seizures Father    Sleep apnea Maternal Aunt     His Social History Is Significant For: Social History   Socioeconomic History   Marital status: Single    Spouse name: Not on file   Number of children: Not on file   Years of education: Not on file   Highest education level: Not on file  Occupational History   Not on file  Tobacco Use   Smoking status: Some Days    Types: Cigarettes   Smokeless tobacco: Never  Vaping Use   Vaping Use: Never used  Substance and Sexual Activity   Alcohol use: No   Drug use: No   Sexual activity: Yes  Other Topics Concern   Not on file  Social History Narrative   Not on file   Social Determinants of Health   Financial Resource Strain: Not on file  Food Insecurity: Not on file  Transportation Needs: Not on file  Physical Activity: Not on file  Stress: Not on file  Social Connections: Not on file    His Allergies Are:  No Known Allergies:   His Current Medications Are:  Outpatient Encounter Medications as of 09/17/2020  Medication Sig   acetaminophen (TYLENOL) 500 MG tablet Take 1,000 mg by mouth every 6 (six) hours as needed for headache.   aspirin-acetaminophen-caffeine (EXCEDRIN EXTRA STRENGTH) 250-250-65 MG  tablet 2 tablets   topiramate (TOPAMAX) 50 MG tablet Take 1 tablet (50 mg total) by mouth 2 (two) times daily. Please follow titration instructions provided separately.   No facility-administered encounter medications on file as of 09/17/2020.  :  Review of Systems:  Out of a complete 14 point review of systems, all are reviewed and negative with the exception of these symptoms as listed below:  Review of Systems  Neurological:        Pt states that he can't use because the mask is very  uncomfortable. Pt has tried several mask and he stated none  of them work . Total has used 4 hours . Have migraines nightly     Epworth Sleepiness Scale 0= would never doze 1= slight chance of dozing 2= moderate chance of dozing 3= high chance of dozing Sitting and reading:0 Watching TV:3 Sitting inactive in a public place (ex. Theater or meeting):2 As a passenger in a car for an hour without a break:2 Lying down to rest in the afternoon:0 Sitting and talking to someone:1 Sitting quietly after lunch (no alcohol):0 In a car, while stopped in traffic:1 Total: 9     Objective:  Neurological Exam  Physical Exam Physical Examination:   Vitals:   09/17/20 1424  BP: (!) 146/93  Pulse: 85    General Examination: The patient is a very pleasant 35 y.o. male in no acute distress. He appears well-developed and well-nourished and well groomed.   HEENT: Normocephalic, atraumatic, pupils are equal, round and reactive to light, extraocular tracking is good without limitation to gaze excursion or nystagmus noted. Normal smooth pursuit is noted. Hearing is grossly intact. Face is symmetric with normal facial animation. Speech is clear with no dysarthria noted. There is no hypophonia. There is no lip, neck/head, jaw or voice tremor. Neck is supple with full range of passive and active motion. There are no carotid bruits on auscultation. Oropharynx exam reveals: mild mouth dryness, adequate dental hygiene and  moderate airway crowding.  Tongue protrudes centrally and palate elevates symmetrically.   Chest: Clear to auscultation without wheezing, rhonchi or crackles noted.   Heart: S1+S2+0, regular and normal without murmurs, rubs or gallops noted.    Abdomen: Soft, non-tender and non-distended.   Extremities: There is no pitting edema in the distal lower extremities bilaterally.    Skin: Warm and dry without trophic changes noted.   Musculoskeletal: exam reveals no obvious joint deformities.    Neurologically:  Mental status: The patient is awake, alert and oriented in all 4 spheres. His immediate and remote memory, attention, language skills and fund of knowledge are appropriate. There is no evidence of aphasia, agnosia, apraxia or anomia. Speech is clear with normal prosody and enunciation. Thought process is linear. Mood is normal and affect is normal.  Cranial nerves II - XII are as described above under HEENT exam. Motor exam: Normal bulk, strength and tone is noted. There is no drift, tremor or rebound. Romberg is negative. Reflexes are 2+ throughout. Fine motor skills and coordination: intact with normal finger taps, normal hand movements, normal rapid alternating patting, normal foot taps and normal foot agility.  Cerebellar testing: No dysmetria or intention tremor. There is no truncal or gait ataxia.  Sensory exam: intact to light touch in the upper and lower extremities.  Gait, station and balance: He stands easily. No veering to one side is noted. No leaning to one side is noted. Posture is age-appropriate and stance is narrow based. Gait shows normal stride length and normal pace. No problems turning are noted. Tandem walk is unremarkable.    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  He was found to have severe obstructive sleep apnea via home sleep testing on 06/29/2020, with an AHI of 44.1/h, O2 nadir 80%.  He has not been able to use his AutoPap, feels that the pressure and the mask are uncomfortable.  He is on Topamax, 100 mg daily, divided into doses.  He continues to have recurrent headaches especially nighttime headaches.  We talked about the importance of treating severe obstructive sleep apnea.  He is advised to continue to work on lifestyle modification and weight loss, he is advised to restart his AutoPap at a lower pressure, I have reduced his pressure settings.  He is advised to consider coming in for a proper titration study.  He is agreeable to this and we will request insurance authorization to optimize his treatment and help with tolerance by bringing him in for a dedicated titration study overnight.  Furthermore, I suggested we increase his Topamax to 100 mg at night and 50 mg in the morning.  He has some paresthesias but they are tolerable and he is willing to try this approach.  We will plan a follow-up after his next sleep study.  I answered all his questions today and he was in agreement with the plan. I spent 30 minutes in total face-to-face time and in reviewing records during pre-charting, more than 50% of which was spent in counseling and coordination of care, reviewing test results, reviewing medications and treatment regimen and/or in discussing or reviewing the diagnosis of OSA, HAs, the prognosis and treatment options. Pertinent laboratory and imaging test results that were available during this visit with the patient were reviewed by me and considered in my medical decision making (see chart for  details).

## 2020-09-21 NOTE — Progress Notes (Signed)
Ross Ludwig, RN He was already setup. I seen an order in there for a pressure change. I have emailed the local office on this.

## 2020-09-23 ENCOUNTER — Telehealth: Payer: Self-pay | Admitting: Neurology

## 2020-09-23 NOTE — Telephone Encounter (Signed)
LVM for pt to call me back to schedule sleep study  

## 2020-10-30 ENCOUNTER — Telehealth: Payer: Self-pay | Admitting: Neurology

## 2020-10-30 DIAGNOSIS — R351 Nocturia: Secondary | ICD-10-CM

## 2020-10-30 DIAGNOSIS — R0683 Snoring: Secondary | ICD-10-CM

## 2020-10-30 DIAGNOSIS — R519 Headache, unspecified: Secondary | ICD-10-CM

## 2020-10-30 DIAGNOSIS — G43019 Migraine without aura, intractable, without status migrainosus: Secondary | ICD-10-CM

## 2020-10-30 DIAGNOSIS — R0681 Apnea, not elsewhere classified: Secondary | ICD-10-CM

## 2020-10-30 NOTE — Telephone Encounter (Signed)
Pt called needing to speak to the RN to be advised on what he can do to alleviate his migraines. Pt states he is still getting them bad.

## 2020-11-02 NOTE — Telephone Encounter (Signed)
I called pt he c/o daily headaches (taking the topiramate 100mg  po qhs, not helping. Other receommendations.  Otc tylenol and exedrin migraine not helping. Has tried tripans in past (imitrex did not help.  He had not had call about mask change I gave him # to DME and will also send another message to [facility] about this. Done.

## 2020-11-02 NOTE — Telephone Encounter (Signed)
I think it might help if we increase the Topamax to 2 pills twice daily.  If he is agreeable, we can increase his Topamax to 100 mg twice daily.  Please send an updated prescription if he is agreeable.  Please ask him if he is currently using his machine for sleep apnea, I think it is imperative that he be treated for his severe sleep apnea.  It looks like he has an upcoming titration study this week.

## 2020-11-03 NOTE — Telephone Encounter (Signed)
I called pt and relayed that rash/itching he stated started about week ago, it is better now.  (It was about a 25cents size on forearm. (Localized reaction).  (I relayed the taper of 1 tab BID x 2 days the 1 tab daily for 2 days then stop.  He will call us back about other options when gone.  He stated was better this afternoon had migraine this am.

## 2020-11-03 NOTE — Telephone Encounter (Signed)
I called pt back.  He states that he does not want to increase the topamax due to he stated he wakes itching, has rash, does not feel right with the topamax he is on.  I relayed that he needs to use cpap for his severe OSA. Which definitely causing him waking with HA.  He has a cpap titration 11-05-20.  I relayed that I would touch base with Dr. Frances Furbish and let her know of any other options for him.  Please advise.

## 2020-11-03 NOTE — Telephone Encounter (Signed)
RE: mask Received: Arlan Organ, Rosendo Gros, RN got it!   Thanks!      Previous Messages   ----- Message -----  From: [name], RN  Sent: 11/02/2020   3:51 PM EDT  To: [name]  Subject: mask                                           Please contact pt, he may also contact you about mask fitting, but had cpap pressure changes too.  Thanks you.Marland Kitchen 09-17-2020 David Stevenson  Male, 35 y.o., Oct 01, 1985  MRN:  [id number]  Phone:  [phone]

## 2020-11-03 NOTE — Telephone Encounter (Signed)
This is the first time I am hearing about itching and rash with Topamax on this patient.  Please advise him to taper off the Topamax by reducing it to 1 pill twice daily for the next 2 days, then 1 pill once daily for the next 2 days and then stop altogether.  Once the rash is resolved, we can consider another medication for headache prevention but we need to wait till the rash and itching have resolved.  If he has any severe rash or itching or hives, he needs to go to the emergency room immediately.

## 2020-11-05 ENCOUNTER — Ambulatory Visit (INDEPENDENT_AMBULATORY_CARE_PROVIDER_SITE_OTHER): Payer: Medicare Other | Admitting: Neurology

## 2020-11-05 ENCOUNTER — Other Ambulatory Visit: Payer: Self-pay

## 2020-11-05 DIAGNOSIS — G4733 Obstructive sleep apnea (adult) (pediatric): Secondary | ICD-10-CM

## 2020-11-05 DIAGNOSIS — G43019 Migraine without aura, intractable, without status migrainosus: Secondary | ICD-10-CM

## 2020-11-05 DIAGNOSIS — Z789 Other specified health status: Secondary | ICD-10-CM

## 2020-11-18 NOTE — Addendum Note (Signed)
Addended by: Huston Foley on: 11/18/2020 05:48 PM   Modules accepted: Orders

## 2020-11-18 NOTE — Procedures (Signed)
S PATIENT'S NAME:  David Stevenson, David Stevenson DOB:      02-08-1985      MR#:    [id number]     DATE OF RECORDING: 11/05/2020 REFERRING M.D.:  Charlane Ferretti, DO Study Performed:   CPAP  Titration HISTORY: 35 year old right-handed gentleman with an underlying medical history of headaches and obesity, who presents for a full night titration study. His home sleep test from 06/29/2020 showed severe obstructive sleep apnea with an AHI of 44.1/h, O2 nadir 80%.  He has had difficulty tolerating AutoPap therapy. The patient endorsed the Epworth Sleepiness Scale at 9 points. The patient's weight 248 pounds with a height of 68 (inches), resulting in a BMI of 37.4 kg/m2. The patient's neck circumference measured 17 inches.  CURRENT MEDICATIONS: Tylenol, Excedrin, Topamax  PROCEDURE:  This is a multichannel digital polysomnogram utilizing the SomnoStar 11.2 system.  Electrodes and sensors were applied and monitored per AASM Specifications.   EEG, EOG, Chin and Limb EMG, were sampled at 200 Hz.  ECG, Snore and Nasal Pressure, Thermal Airflow, Respiratory Effort, CPAP Flow and Pressure, Oximetry was sampled at 50 Hz. Digital video and audio were recorded.      The patient was fitted with a sm/med [place] FFM from [employer]. CPAP was initiated at 5 cmH20 with heated humidity per AASM standards and pressure was advanced to 14 cmH20 because of hypopneas, apneas and desaturations.  At a PAP pressure of 14 cmH20, there was a reduction of the AHI to 2.3/hour with supine NREM sleep achieved and O2 nadir of 89%.   Lights Out was at 20:55 and Lights On at 04:50. Total recording time (TRT) was 475 minutes, with a total sleep time (TST) of 413.5 minutes. The patient's sleep latency was 44 minutes. REM latency was 45 minutes.  The sleep efficiency was 87.1 %.    SLEEP ARCHITECTURE: WASO (Wake after sleep onset)  was 15 minutes with minimal to mild sleep fragmentation noted.  There were 24.5 minutes in Stage N1, 234 minutes Stage N2, 51 minutes  Stage N3 and 104 minutes in Stage REM.  The percentage of Stage N1 was 5.9%, Stage N2 was 56.6%, Stage N3 was 12.3% and Stage R (REM sleep) was 25.2%. The arousals were noted as: 66 were spontaneous, 0 were associated with PLMs, 16 were associated with respiratory events.  RESPIRATORY ANALYSIS:  There was a total of 66 respiratory events: 5 obstructive apneas, 0 central apneas and 0 mixed apneas with a total of 5 apneas and an apnea index (AI) of .7 /hour. There were 61 hypopneas with a hypopnea index of 8.9/hour. The patient also had 0 respiratory event related arousals (RERAs).      The total APNEA/HYPOPNEA INDEX  (AHI) was 9.6 /hour and the total RESPIRATORY DISTURBANCE INDEX was 9.6 /hour  33 events occurred in REM sleep and 33 events in NREM. The REM AHI was 19. /hour versus a non-REM AHI of 6.4 /hour.  The patient spent 233.5 minutes of total sleep time in the supine position and 180 minutes in non-supine. The supine AHI was 8.5, versus a non-supine AHI of 11.0.  OXYGEN SATURATION & C02:  The baseline 02 saturation was 96%, with the lowest being 82%. Time spent below 89% saturation equaled 5 minutes.  PERIODIC LIMB MOVEMENTS:  The patient had a total of 0 Periodic Limb Movements. The Periodic Limb Movement (PLM) index was 0 and the PLM Arousal index was 0 /hour.  Audio and video analysis did not show any abnormal or unusual  movements, behaviors, phonations or vocalizations. The patient took no bathroom breaks. The EKG was in keeping with normal sinus rhythm (NSR).  Post-study, the patient indicated that sleep was the same as usual.   DIAGNOSIS:   Obstructive Sleep Apnea   PLANS/RECOMMENDATIONS: This study significant improvement of the patient's obstructive sleep apnea with CPAP therapy. I will, therefore, start the patient on home CPAP treatment at a pressure of 14 cm via FFM with (heated) humidity. The patient will be advised to be fully compliant with PAP therapy to improve sleep related  symptoms and decrease long term cardiovascular risks. The patient should be reminded, that it may take up to 3 months to get fully used to using PAP with all planned sleep. The earlier full compliance is achieved, the better long term compliance tends to be. Please note that untreated obstructive sleep apnea may carry additional perioperative morbidity. Patients with significant obstructive sleep apnea should receive perioperative PAP therapy and the surgeons and particularly the anesthesiologist should be informed of the diagnosis and the severity of the sleep disordered breathing. The patient should be cautioned not to drive, work at heights, or operate dangerous or heavy equipment when tired or sleepy. Review and reiteration of good sleep hygiene measures should be pursued with any patient. The patient will be seen in follow-up in the sleep clinic at [facility] for discussion of the test results, symptom and treatment compliance review, further management strategies, etc. The referring provider will be notified of the test results.  I certify that I have reviewed the entire raw data recording prior to the issuance of this report in accordance with the Standards of Accreditation of the American Academy of Sleep Medicine (AASM)  Huston Foley, MD, PhD Diplomat, American Board of Neurology and Sleep Medicine (Neurology and Sleep Medicine)

## 2020-11-19 ENCOUNTER — Telehealth: Payer: Self-pay | Admitting: *Deleted

## 2020-11-19 NOTE — Telephone Encounter (Signed)
-----   Message from Huston Foley, MD sent at 11/18/2020  5:47 PM EDT ----- Patient had a CPAP titration study on 11/05/20. He has had trouble tolerating autoPAP.  Please call and inform patient that I have entered an order for treatment with positive airway pressure (PAP) treatment for obstructive sleep apnea (OSA). He did well during the latest sleep study with CPAP. We will, therefore, change his treatment from autoPAP to CPAP of 14 cm through his DME (durable medical equipment) company. He will need a FU within 3 mo.

## 2020-11-19 NOTE — Telephone Encounter (Signed)
Spoke with patient's s.o. Shanelle (on DPR) regarding patient's sleep study results.  She was not really in a position to discuss everything in detail so we discussed that I could send a message to patient's MyChart for him to review the sleep study results.  She is aware changes will be made to his machine based off the results of the sleep study and [facility] will be in touch with the patient.  She asked about his migraines.  She is aware that uncontrolled sleep apnea can contribute to headaches.  Follow-up appointment will be needed for insurance purposes for the CPAP.  Patient can get that scheduled once we hear back from him.  She verbalized appreciation for the call and her questions were answered.   Order sent to [employer].

## 2020-11-19 NOTE — Progress Notes (Signed)
CM sent to [facility] 

## 2020-11-20 ENCOUNTER — Emergency Department (HOSPITAL_BASED_OUTPATIENT_CLINIC_OR_DEPARTMENT_OTHER)
Admission: EM | Admit: 2020-11-20 | Discharge: 2020-11-20 | Disposition: A | Discharge status: Home / Self Care | Payer: Medicare Other | Attending: Emergency Medicine | Admitting: Emergency Medicine

## 2020-11-20 ENCOUNTER — Other Ambulatory Visit: Payer: Self-pay

## 2020-11-20 ENCOUNTER — Encounter (HOSPITAL_BASED_OUTPATIENT_CLINIC_OR_DEPARTMENT_OTHER): Payer: Self-pay

## 2020-11-20 DIAGNOSIS — Z8616 Personal history of COVID-19: Secondary | ICD-10-CM | POA: Diagnosis not present

## 2020-11-20 DIAGNOSIS — G43909 Migraine, unspecified, not intractable, without status migrainosus: Secondary | ICD-10-CM | POA: Insufficient documentation

## 2020-11-20 DIAGNOSIS — Z7982 Long term (current) use of aspirin: Secondary | ICD-10-CM | POA: Diagnosis not present

## 2020-11-20 DIAGNOSIS — F17210 Nicotine dependence, cigarettes, uncomplicated: Secondary | ICD-10-CM | POA: Diagnosis not present

## 2020-11-20 DIAGNOSIS — R519 Headache, unspecified: Secondary | ICD-10-CM | POA: Diagnosis not present

## 2020-11-20 MED ORDER — ONDANSETRON 4 MG PO TBDP
4.0000 mg | ORAL_TABLET | Freq: Once | ORAL | Status: AC
  Administered 2020-11-20: 4 mg via ORAL
  Filled 2020-11-20: qty 1

## 2020-11-20 MED ORDER — KETOROLAC TROMETHAMINE 15 MG/ML IJ SOLN
15.0000 mg | Freq: Once | INTRAMUSCULAR | Status: DC
  Filled 2020-11-20: qty 1

## 2020-11-20 MED ORDER — ASPIRIN-ACETAMINOPHEN-CAFFEINE 250-250-65 MG PO TABS
1.0000 | ORAL_TABLET | Freq: Once | ORAL | Status: AC
  Administered 2020-11-20: 1 via ORAL
  Filled 2020-11-20: qty 1

## 2020-11-20 MED ORDER — DIPHENHYDRAMINE HCL 50 MG/ML IJ SOLN
50.0000 mg | Freq: Once | INTRAMUSCULAR | Status: AC
  Administered 2020-11-20: 50 mg via INTRAVENOUS
  Filled 2020-11-20: qty 1

## 2020-11-20 MED ORDER — METOCLOPRAMIDE HCL 5 MG/ML IJ SOLN
10.0000 mg | Freq: Once | INTRAMUSCULAR | Status: AC
  Administered 2020-11-20: 10 mg via INTRAVENOUS
  Filled 2020-11-20: qty 2

## 2020-11-20 MED ORDER — KETOROLAC TROMETHAMINE 15 MG/ML IJ SOLN
15.0000 mg | Freq: Once | INTRAMUSCULAR | Status: AC
  Administered 2020-11-20: 15 mg via INTRAVENOUS

## 2020-11-20 NOTE — Discharge Instructions (Addendum)
You came to the emergency department today to be evaluated for your headache.  Your physical exam was reassuring.  You received a migraine cocktail in the emergency department with improvement in her symptoms.  You also received Toradol, please do not take any NSAID medication for the next 24 hours.  Please follow-up with your neurologist for your history of migraines.  I have given you information for another neurologist if you feel you need to get a second opinion.  Today you received medications that may make you sleepy or impair your ability to make decisions.  For the next 24 hours please do not drive, operate heavy machinery, care for a small child with out another adult present, or perform any activities that may cause harm to you or someone else if you were to fall asleep or be impaired.   Get help right away if: Your headache becomes severe quickly. Your headache gets worse after moderate to intense physical activity. You have repeated vomiting. You have a stiff neck. You have a loss of vision. You have problems with speech. You have pain in the eye or ear. You have muscular weakness or loss of muscle control. You lose your balance or have trouble walking. You feel faint or pass out. You have confusion. You have a seizure.

## 2020-11-20 NOTE — ED Triage Notes (Signed)
Pt BIB [facility] with a migraine headache. States he woke up this am with a migraine, hx of migraines, prescribed propanolol and Topamax w/no relief. Pt started propanolol 1 weeks ago. Pt reports sensitivity to light and nausea

## 2020-11-20 NOTE — ED Notes (Signed)
Informed Peter - PA of pt's BP

## 2020-11-20 NOTE — ED Notes (Signed)
Pt discharged to home. Discharge instructions have been discussed with patient and/or family members. Pt verbally acknowledges understanding d/c instructions, and endorses comprehension to checkout at registration before leaving.  °

## 2020-11-20 NOTE — ED Provider Notes (Signed)
[facility] EMERGENCY DEPT Provider Note   CSN: [id number] Arrival date & time: 11/20/20  1214     History Chief Complaint  Patient presents with   Migraine    David Stevenson is a 35 y.o. male with a history of migraine and seizures.  Presents to the emergency department with a chief complaint of migraine headache.  Patient reports that his headache started this morning at approximately 1000.  Patient reports that pain onset was gradual and pain progressively worse over time.  Patient reports that headache was generalized throughout his entire head however at present pain located to crown of head.  Patient rates pain 10/on the pain scale.  Pain is worse with eating chocolate and bright lights.  Patient denies trying any medication to alleviate his symptoms.  Patient reports this feels similar to previous migraines he has had in the past.  Denies any visual disturbance, numbness, weakness, facial asymmetry, dysarthria, aphasia.  No recent falls or injuries.   Migraine Associated symptoms include headaches. Pertinent negatives include no chest pain, no abdominal pain and no shortness of breath.      Past Medical History:  Diagnosis Date   COVID-19    Migraine    Seizures (HCC)    pt last seizure appx 14 years ago    Patient Active Problem List   Diagnosis Date Noted   History of COVID-19 10/28/2019   Cough 10/28/2019    Past Surgical History:  Procedure Laterality Date   ABDOMINAL SURGERY         Family History  Problem Relation Age of Onset   Seizures Father    Sleep apnea Maternal Aunt     Social History   Tobacco Use   Smoking status: Some Days    Types: Cigarettes   Smokeless tobacco: Never  Vaping Use   Vaping Use: Never used  Substance Use Topics   Alcohol use: No   Drug use: No    Home Medications Prior to Admission medications   Medication Sig Start Date End Date Taking? Authorizing Provider  aspirin-acetaminophen-caffeine  (EXCEDRIN EXTRA STRENGTH) [phone] MG tablet 2 tablets   Yes [provider]  acetaminophen (TYLENOL) 500 MG tablet Take 1,000 mg by mouth every 6 (six) hours as needed for headache.    [provider]  topiramate (TOPAMAX) 50 MG tablet Take 1 pill in the morning and 2 pills at bedtime. 09/17/20   Huston Foley, MD    Allergies    Topamax [topiramate]  Review of Systems   Review of Systems  Constitutional:  Negative for chills and fever.  Eyes:  Negative for visual disturbance.  Respiratory:  Negative for shortness of breath.   Cardiovascular:  Negative for chest pain.  Gastrointestinal:  Negative for abdominal pain, nausea and vomiting.  Genitourinary:  Negative for difficulty urinating.  Musculoskeletal:  Negative for back pain and neck pain.  Skin:  Negative for color change and rash.  Neurological:  Positive for headaches. Negative for dizziness, tremors, seizures, syncope, facial asymmetry, speech difficulty, weakness, light-headedness and numbness.  Psychiatric/Behavioral:  Negative for confusion.    Physical Exam Updated Vital Signs BP (!) 141/99 (BP Location: Right Arm)   Pulse (!) 57   Temp 97.7 F (36.5 C) (Oral)   Resp 18   SpO2 100%   Physical Exam Vitals and nursing note reviewed.  Constitutional:      General: He is not in acute distress.    Appearance: He is not ill-appearing, toxic-appearing or diaphoretic.  HENT:     Head: Normocephalic and atraumatic. No raccoon eyes or Battle's sign.  Eyes:     General: No scleral icterus.       Right eye: No discharge.        Left eye: No discharge.     Extraocular Movements: Extraocular movements intact.     Pupils: Pupils are equal, round, and reactive to light.  Cardiovascular:     Rate and Rhythm: Normal rate.  Pulmonary:     Effort: Pulmonary effort is normal.  Abdominal:     Palpations: Abdomen is soft.     Tenderness: There is no abdominal tenderness.  Musculoskeletal:     Cervical back:  Normal range of motion and neck supple. No rigidity.  Skin:    General: Skin is warm and dry.  Neurological:     General: No focal deficit present.     Mental Status: He is alert and oriented to person, place, and time.     GCS: GCS eye subscore is 4. GCS verbal subscore is 5. GCS motor subscore is 6.     Cranial Nerves: No cranial nerve deficit or facial asymmetry.     Sensory: Sensation is intact.     Motor: No weakness, tremor, seizure activity or pronator drift.     Coordination: Romberg sign negative. Finger-Nose-Finger Test normal.     Gait: Gait is intact. Gait normal.     Comments: CN II-XII intact, equal grip strength, +5 strength to bilateral upper and lower extremities, sensation to light touch intact to bilateral upper and lower extremities.    Psychiatric:        Behavior: Behavior is cooperative.    ED Results / Procedures / Treatments   Labs (all labs ordered are listed, but only abnormal results are displayed) Labs Reviewed - No data to display  EKG None  Radiology No results found.  Procedures Procedures   Medications Ordered in ED Medications  ketorolac (TORADOL) 15 MG/ML injection 15 mg (has no administration in time range)  aspirin-acetaminophen-caffeine (EXCEDRIN MIGRAINE) per tablet 1 tablet (1 tablet Oral Given 11/20/20 1249)  ondansetron (ZOFRAN-ODT) disintegrating tablet 4 mg (4 mg Oral Given 11/20/20 1249)  metoCLOPramide (REGLAN) injection 10 mg (10 mg Intravenous Given 11/20/20 1625)  diphenhydrAMINE (BENADRYL) injection 50 mg (50 mg Intravenous Given 11/20/20 1624)    ED Course  I have reviewed the triage vital signs and the nursing notes.  Pertinent labs & imaging results that were available during my care of the patient were reviewed by me and considered in my medical decision making (see chart for details).    MDM Rules/Calculators/A&P                           Alert 35 year old male in no acute distress, nontoxic-appearing.   Presents to ED with chief complaint of migraine headache.  Patient has history of the same.  Headache onset was gradual pain progressively worse over time.  Patient denies any worsening of symptoms with exertion.  Patient reports this headache is similar to previous migraine she has had in the past.  Neuro exam is reassuring.  Low suspicion for subarachnoid hemorrhage at this time.  We will give patient migraine cocktail and reassess.  On reassessment patient reports improvement in his headache.  Patient now rates headache 3/10 on the pain scale.  Will give patient IM injection of Toradol for continued headache pain.  Patient denies any history of kidney dysfunction  or blood thinner use.  Patient was also noted to be hypertensive on the emergency department.  Patient denies any known history of blood pressure.  We will have patient follow-up with primary care provider for repeat assessment of hypertension when not any acute pain.  We will have patient follow-up with neurology in the outpatient setting.  Discussed results, findings, treatment and follow up. Patient advised of return precautions. Patient verbalized understanding and agreed with plan.   Final Clinical Impression(s) / ED Diagnoses Final diagnoses:  Acute nonintractable headache, unspecified headache type    Rx / DC Orders ED Discharge Orders     None        Haskel Schroeder, PA-C 11/20/20 1803    Vanetta Mulders, MD 11/25/20 [phone]

## 2020-11-23 MED ORDER — AMITRIPTYLINE HCL 25 MG PO TABS: 50.0000 mg | ORAL_TABLET | Freq: Every day | ORAL | 3 refills | Status: DC

## 2020-11-23 MED ORDER — AMITRIPTYLINE HCL 25 MG PO TABS
50.0000 mg | ORAL_TABLET | Freq: Every day | ORAL | 3 refills | Status: DC
Start: 2020-11-23 — End: 2021-08-11

## 2020-11-23 NOTE — Addendum Note (Signed)
Addended by: [name] on: 11/23/2020 04:41 PM   Modules accepted: Orders

## 2020-11-23 NOTE — Addendum Note (Signed)
Addended by: Huston Foley on: 11/23/2020 02:26 PM   Modules accepted: Orders

## 2020-11-23 NOTE — Telephone Encounter (Signed)
I called pt vs email.  He did go to ED and received migraine cocktail (benadryl, toradol, reglan).  This did help.  He states that he has headaches that will wake him from sleep (when dreaming), had one this morning, took excedrin migraine after an hour is better.   Is waiting to get new mask tomorrow.  He is not taking topamax.  I offered sooner appt of cancellation.   Do you have any other recommendations?

## 2020-11-23 NOTE — Telephone Encounter (Addendum)
Please pull compliance data from his CPAP machine.  It is important that he be compliant with his CPAP for treatment of his recurrent headaches.  We can start him on a trial of amitriptyline low-dose with gradual titration.  I have placed an order for his prescription.  Please advise patient that he can start Elavil (generic name: amitriptyline) 25 mg: Take half a pill daily at bedtime for one week, then one pill daily at bedtime for one week, then one and a half pills daily at bedtime for one week, then 2 pills daily at bedtime thereafter. Common side effects reported are: mouth dryness, drowsiness, confusion, dizziness.  I have sent a prescription to the [facility] pharmacy on file. If need be, placement titration instructions via MyChart message.

## 2020-11-23 NOTE — Telephone Encounter (Signed)
I called pt and relayed the titration instructions. I relayed that important to get mask and start cpap will make difference on your headaches.   He has not used his machine.  I emailed the titration to him via [place] as well as going over the instructions verbally as well. He verbalized understanding. He will call back as needed.

## 2020-11-29 ENCOUNTER — Telehealth: Payer: Medicare Other | Admitting: Family

## 2020-11-29 ENCOUNTER — Encounter: Payer: Self-pay | Admitting: Family

## 2020-11-29 DIAGNOSIS — J029 Acute pharyngitis, unspecified: Secondary | ICD-10-CM | POA: Diagnosis not present

## 2020-11-29 MED ORDER — CETIRIZINE HCL 10 MG PO TABS: 10.0000 mg | ORAL_TABLET | Freq: Every day | ORAL | 11 refills | Status: DC

## 2020-11-29 MED ORDER — NAPROXEN 500 MG PO TABS: 500.0000 mg | ORAL_TABLET | Freq: Two times a day (BID) | ORAL | 0 refills | Status: AC

## 2020-11-29 MED ORDER — FLUTICASONE PROPIONATE 50 MCG/ACT NA SUSP: 2.0000 | Freq: Every day | NASAL | 6 refills | Status: DC

## 2020-11-29 NOTE — Progress Notes (Signed)
Virtual Visit Consent   David Stevenson, you are scheduled for a virtual visit with a [facility] provider today.     Just as with appointments in the office, your consent must be obtained to participate.  Your consent will be active for this visit and any virtual visit you may have with one of our providers in the next 365 days.     If you have a MyChart account, a copy of this consent can be sent to you electronically.  All virtual visits are billed to your insurance company just like a traditional visit in the office.    As this is a virtual visit, video technology does not allow for your provider to perform a traditional examination.  This may limit your provider's ability to fully assess your condition.  If your provider identifies any concerns that need to be evaluated in person or the need to arrange testing (such as labs, EKG, etc.), we will make arrangements to do so.     Although advances in technology are sophisticated, we cannot ensure that it will always work on either your end or our end.  If the connection with a video visit is poor, the visit may have to be switched to a telephone visit.  With either a video or telephone visit, we are not always able to ensure that we have a secure connection.     I need to obtain your verbal consent now.   Are you willing to proceed with your visit today?    David Stevenson has provided verbal consent on 11/29/2020 for a virtual visit (video or telephone).   Jannifer Rodney, FNP   Date: 11/29/2020 8:57 AM   Virtual Visit via Video Note   I, Jannifer Rodney, connected with  David Stevenson  ([id number], 1985/01/29) on 11/29/20 at  8:30 AM EST by a video-enabled telemedicine application and verified that I am speaking with the correct person using two identifiers.  Location: Patient: Virtual Visit Location Patient: Home Provider: Virtual Visit Location Provider: Home   I discussed the limitations of evaluation and management by telemedicine  and the availability of in person appointments. The patient expressed understanding and agreed to proceed.    History of Present Illness: David Stevenson is a 35 y.o. who identifies as a male who was assigned male at birth, and is being seen today for sore throat.  HPI: Sore Throat  This is a new problem. The current episode started yesterday. The problem has been gradually worsening. There has been no fever. The pain is at a severity of 6/10. The pain is mild. Associated symptoms include headaches and trouble swallowing. Pertinent negatives include no congestion, coughing, diarrhea, drooling, ear pain, shortness of breath or swollen glands. He has had no exposure to strep. Treatments tried: spray. The treatment provided mild relief.   Problems:  Patient Active Problem List   Diagnosis Date Noted   History of COVID-19 10/28/2019   Cough 10/28/2019    Allergies:  Allergies  Allergen Reactions   Topamax [Topiramate]     Itching, rash (localized forearm 25 cents size)   Medications:  Current Outpatient Medications:    cetirizine (ZYRTEC) 10 MG tablet, Take 1 tablet (10 mg total) by mouth daily., Disp: 30 tablet, Rfl: 11   fluticasone (FLONASE) 50 MCG/ACT nasal spray, Place 2 sprays into both nostrils daily., Disp: 16 g, Rfl: 6   acetaminophen (TYLENOL) 500 MG tablet, Take 1,000 mg by mouth every 6 (six) hours as needed for headache., Disp: ,  Rfl:    amitriptyline (ELAVIL) 25 MG tablet, Take 2 tablets (50 mg total) by mouth at bedtime. Follow titration instructions provided separately., Disp: 60 tablet, Rfl: 3   aspirin-acetaminophen-caffeine (EXCEDRIN EXTRA STRENGTH) 250-250-65 MG tablet, 2 tablets, Disp: , Rfl:   Observations/Objective: Patient is well-developed, well-nourished in no acute distress.  Resting comfortably  at home.  Head is normocephalic, atraumatic.  No labored breathing.  Speech is clear and coherent with logical content.  Patient is alert and oriented at baseline.     Assessment and Plan: 1. Acute pharyngitis, unspecified etiology - fluticasone (FLONASE) 50 MCG/ACT nasal spray; Place 2 sprays into both nostrils daily.  Dispense: 16 g; Refill: 6 - cetirizine (ZYRTEC) 10 MG tablet; Take 1 tablet (10 mg total) by mouth daily.  Dispense: 30 tablet; Refill: 11 - Take meds as prescribed - Use a cool mist humidifier  -Use saline nose sprays frequently -Force fluids -For any cough or congestion  Use plain Mucinex- regular strength or max strength is fine -For fever or aces or pains- take tylenol or ibuprofen. -Throat lozenges if help -New toothbrush in 3 days Start zyrtec, flonase, and naprosyn No other NSAID's  Follow Up Instructions: I discussed the assessment and treatment plan with the patient. The patient was provided an opportunity to ask questions and all were answered. The patient agreed with the plan and demonstrated an understanding of the instructions.  A copy of instructions were sent to the patient via MyChart unless otherwise noted below.     The patient was advised to call back or seek an in-person evaluation if the symptoms worsen or if the condition fails to improve as anticipated.  Time:  I spent 8 minutes with the patient via telehealth technology discussing the above problems/concerns.    Jannifer Rodney, FNP

## 2020-11-29 NOTE — Patient Instructions (Signed)
Pharyngitis °Pharyngitis is inflammation of the throat (pharynx). It is a very common cause of sore throat. Pharyngitis can be caused by a bacteria, but it is usually caused by a virus. Most cases of pharyngitis get better on their own without treatment. °What are the causes? °This condition may be caused by: °Infection by viruses (viral). Viral pharyngitis spreads easily from person to person (is contagious) through coughing, sneezing, and sharing of personal items or utensils such as cups, forks, spoons, and toothbrushes. °Infection by bacteria (bacterial). Bacterial pharyngitis may be spread by touching the nose or face after coming in contact with the bacteria, or through close contact, such as kissing. °Allergies. Allergies can cause buildup of mucus in the throat (post-nasal drip), leading to inflammation and irritation. Allergies can also cause blocked nasal passages, forcing breathing through the mouth, which dries and irritates the throat. °What increases the risk? °You are more likely to develop this condition if: °You are 5-24 years old. °You are exposed to crowded environments such as daycare, school, or dormitory living. °You live in a cold climate. °You have a weakened disease-fighting (immune) system. °What are the signs or symptoms? °Symptoms of this condition vary by the cause. Common symptoms of this condition include: °Sore throat. °Fatigue. °Low-grade fever. °Stuffy nose (nasal congestion) and cough. °Headache. °Other symptoms may include: °Glands in the neck (lymph nodes) that are swollen. °Skin rashes. °Plaque-like film on the throat or tonsils. This is often a symptom of bacterial pharyngitis. °Vomiting. °Red, itchy eyes (conjunctivitis). °Loss of appetite. °Joint pain and muscle aches. °Enlarged tonsils. °How is this diagnosed? °This condition may be diagnosed based on your medical history and a physical exam. Your health care provider will ask you questions about your illness and your  symptoms. °A swab of your throat may be done to check for bacteria (rapid strep test). Other lab tests may also be done, depending on the suspected cause, but these are rare. °How is this treated? °Many times, treatment is not needed for this condition. Pharyngitis usually gets better in 3-4 days without treatment. °Bacterial pharyngitis may be treated with antibiotic medicines. °Follow these instructions at home: °Medicines °Take over-the-counter and prescription medicines only as told by your health care provider. °If you were prescribed an antibiotic medicine, take it as told by your health care provider. Do not stop taking the antibiotic even if you start to feel better. °Use throat sprays to soothe your throat as told by your health care provider. °Children can get pharyngitis. Do not give your child aspirin because of the association with Reye's syndrome. °Managing pain °To help with pain, try: °Sipping warm liquids, such as broth, herbal tea, or warm water. °Eating or drinking cold or frozen liquids, such as frozen ice pops. °Gargling with a mixture of salt and water 3-4 times a day or as needed. To make salt water, completely dissolve ½-1 tsp (3-6 g) of salt in 1 cup (237 mL) of warm water. °Sucking on hard candy or throat lozenges. °Putting a cool-mist humidifier in your bedroom at night to moisten the air. °Sitting in the bathroom with the door closed for 5-10 minutes while you run hot water in the shower. ° °General instructions ° °Do not use any products that contain nicotine or tobacco. These products include cigarettes, chewing tobacco, and vaping devices, such as e-cigarettes. If you need help quitting, ask your health care provider. °Rest as told by your health care provider. °Drink enough fluid to keep your urine pale yellow. °How   is this prevented? °To help prevent becoming infected or spreading infection: °Wash your hands often with soap and water for at least 20 seconds. If soap and water are not  available, use hand sanitizer. °Do not touch your eyes, nose, or mouth with unwashed hands, and wash hands after touching these areas. °Do not share cups or eating utensils. °Avoid close contact with people who are sick. °Contact a health care provider if: °You have large, tender lumps in your neck. °You have a rash. °You cough up green, yellow-brown, or bloody mucus. °Get help right away if: °Your neck becomes stiff. °You drool or are unable to swallow liquids. °You cannot drink or take medicines without vomiting. °You have severe pain that does not go away, even after you take medicine. °You have trouble breathing, and it is not caused by a stuffy nose. °You have new pain and swelling in your joints such as the knees, ankles, wrists, or elbows. °These symptoms may represent a serious problem that is an emergency. Do not wait to see if the symptoms will go away. Get medical help right away. Call your local emergency services (911 in the [place] Do not drive yourself to the hospital. °Summary °Pharyngitis is redness, pain, and swelling (inflammation) of the throat (pharynx). °While pharyngitis can be caused by a bacteria, the most common causes are viral. °Most cases of pharyngitis get better on their own without treatment. °Bacterial pharyngitis is treated with antibiotic medicines. °This information is not intended to replace advice given to you by your health care provider. Make sure you discuss any questions you have with your health care provider. °Document Revised: 04/08/2020 Document Reviewed: 04/08/2020 °Elsevier Patient Education © 2022 [employer] ° °

## 2020-12-13 ENCOUNTER — Telehealth: Payer: Medicare Other | Admitting: Family

## 2020-12-13 DIAGNOSIS — J020 Streptococcal pharyngitis: Secondary | ICD-10-CM

## 2020-12-13 MED ORDER — AMOXICILLIN 500 MG PO CAPS: 500.0000 mg | ORAL_CAPSULE | Freq: Two times a day (BID) | ORAL | 0 refills | Status: AC

## 2020-12-13 NOTE — Progress Notes (Signed)

## 2020-12-24 ENCOUNTER — Other Ambulatory Visit: Payer: Self-pay | Admitting: Family

## 2020-12-24 DIAGNOSIS — J029 Acute pharyngitis, unspecified: Secondary | ICD-10-CM

## 2021-01-19 ENCOUNTER — Other Ambulatory Visit: Payer: Self-pay | Admitting: Family

## 2021-01-19 DIAGNOSIS — J029 Acute pharyngitis, unspecified: Secondary | ICD-10-CM

## 2021-02-23 ENCOUNTER — Emergency Department (HOSPITAL_COMMUNITY)
Admission: EM | Admit: 2021-02-23 | Discharge: 2021-02-24 | Discharge status: Against Medical Advice | Payer: Medicare Other | Attending: Emergency Medicine | Admitting: Emergency Medicine

## 2021-02-23 ENCOUNTER — Emergency Department (HOSPITAL_COMMUNITY): Payer: Medicare Other

## 2021-02-23 DIAGNOSIS — R0781 Pleurodynia: Secondary | ICD-10-CM | POA: Insufficient documentation

## 2021-02-23 DIAGNOSIS — Z5321 Procedure and treatment not carried out due to patient leaving prior to being seen by health care provider: Secondary | ICD-10-CM | POA: Insufficient documentation

## 2021-02-23 DIAGNOSIS — Y92410 Unspecified street and highway as the place of occurrence of the external cause: Secondary | ICD-10-CM | POA: Diagnosis not present

## 2021-02-23 IMAGING — CT CT ANGIO CHEST
2 of 6 series · 19 of 46 positions shown · IV contrast (omnipaque)
Comparison: None.

CLINICAL DATA: COVID positive shortness of breath

EXAM:
CT ANGIOGRAPHY CHEST WITH CONTRAST
TECHNIQUE: Multidetector CT imaging of the chest was performed using the
standard protocol during bolus administration of intravenous
contrast. Multiplanar CT image reconstructions and MIPs were
obtained to evaluate the vascular anatomy.
CONTRAST:  75mL OMNIPAQUE IOHEXOL 350 MG/ML SOLN

[Series 6: thins · axial · 0.81mm/px · z∈[-420,-135]mm · 16 of 313 slices shown]
[im 14/313  lung]
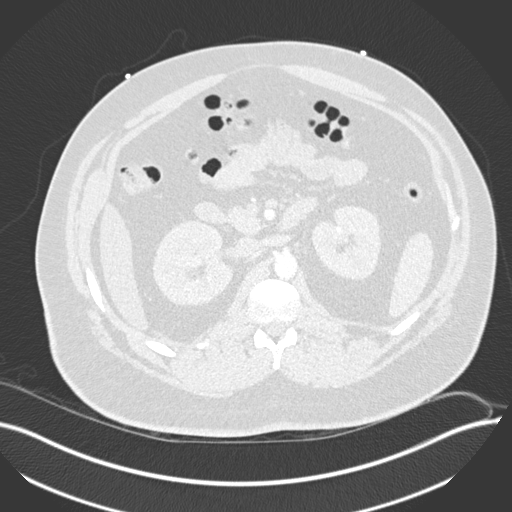
[im 41/313  soft-tissue]
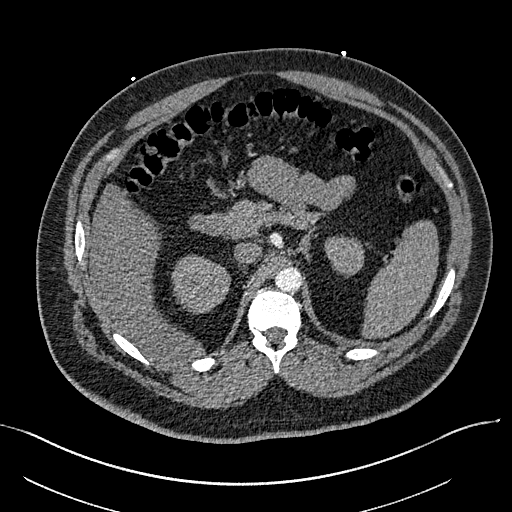
[im 55/313  lung]
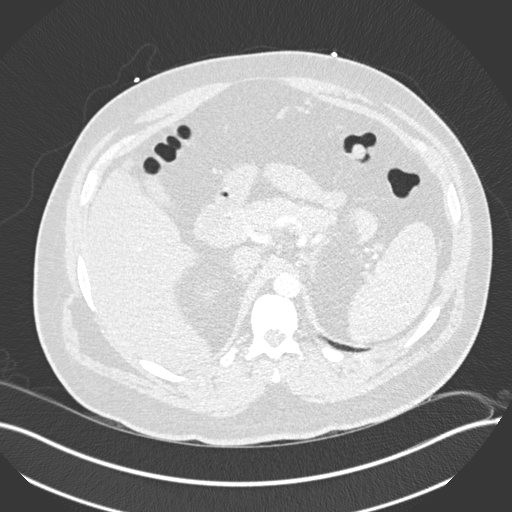
[im 68/313  soft-tissue]
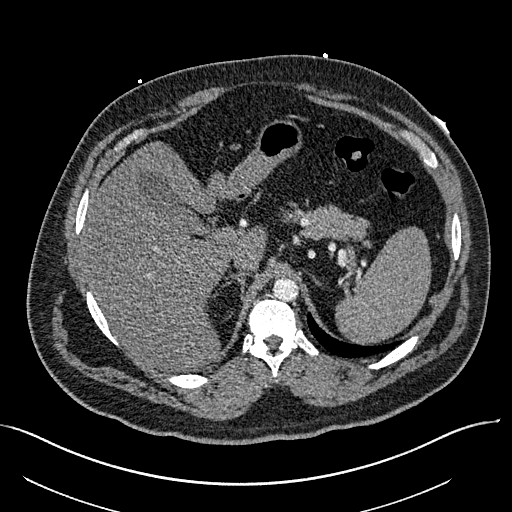
[im 95/313  lung]
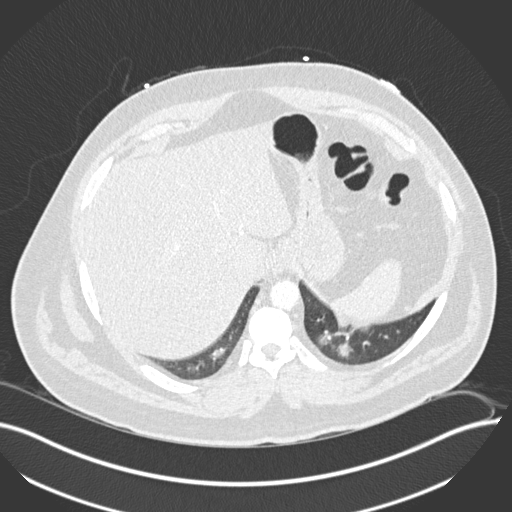
[im 109/313  soft-tissue]
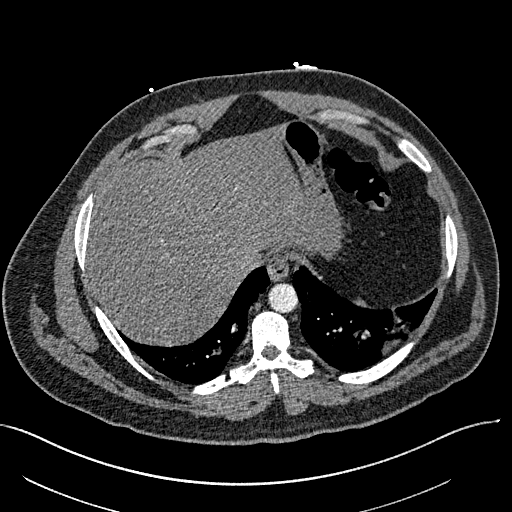
[im 123/313  lung]
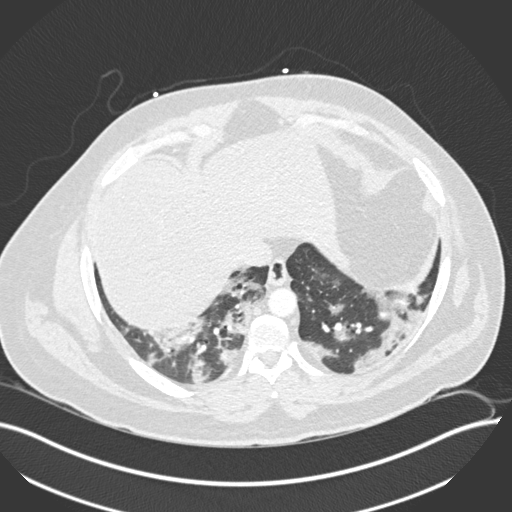
[im 150/313  soft-tissue]
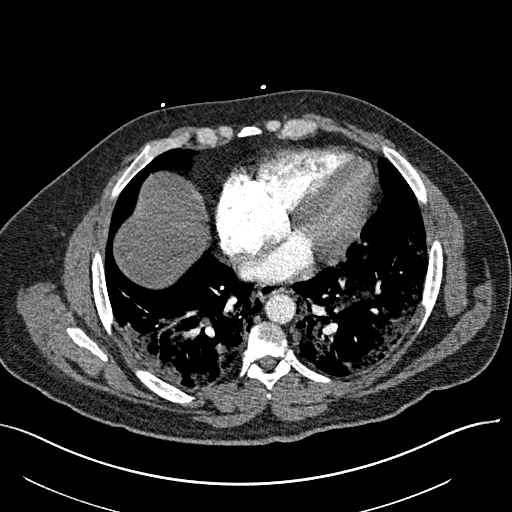
[im 163/313  lung]
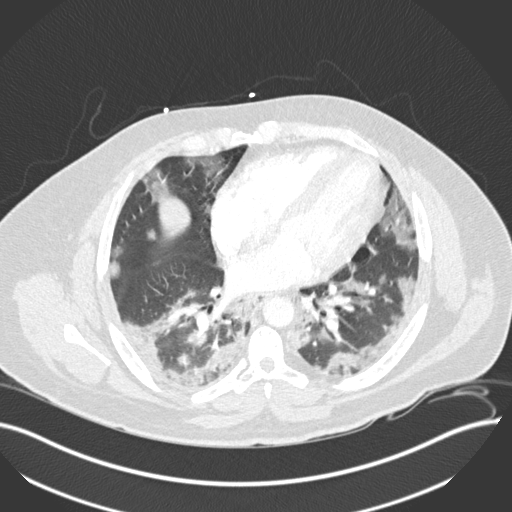
[im 190/313  soft-tissue]
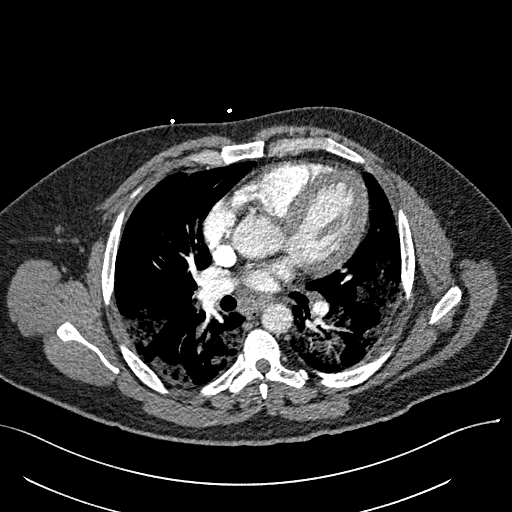
[im 204/313  lung]
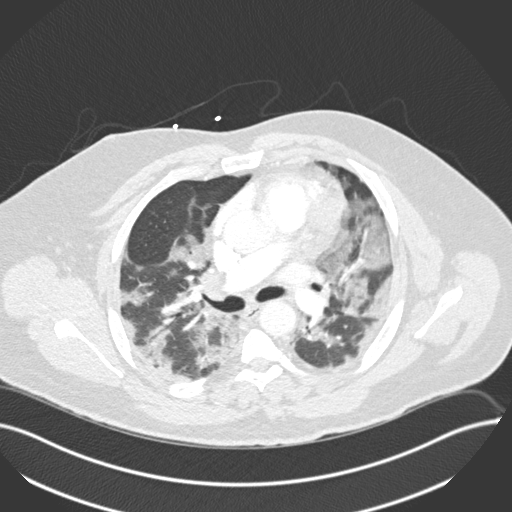
[im 218/313  soft-tissue]
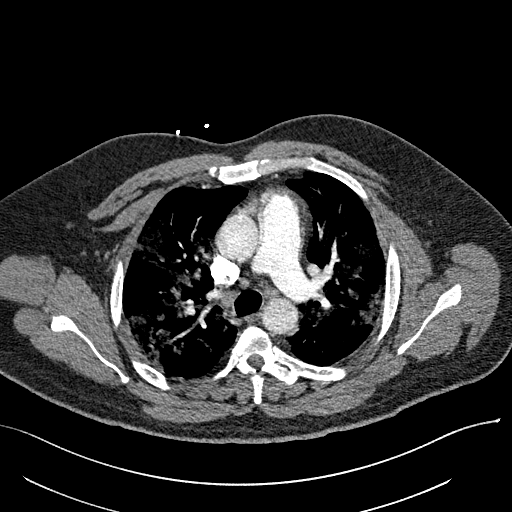
[im 245/313  lung]
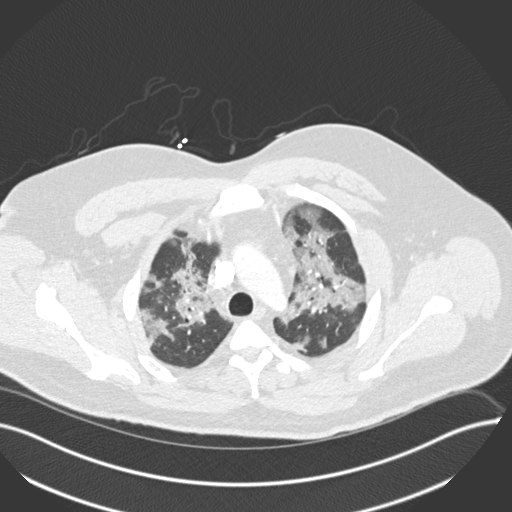
[im 258/313  soft-tissue]
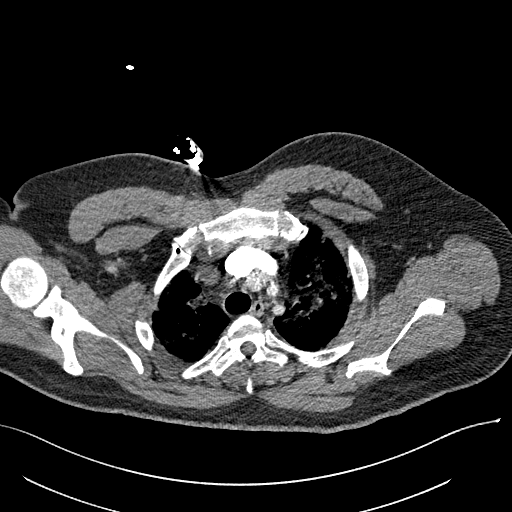
[im 272/313  lung]
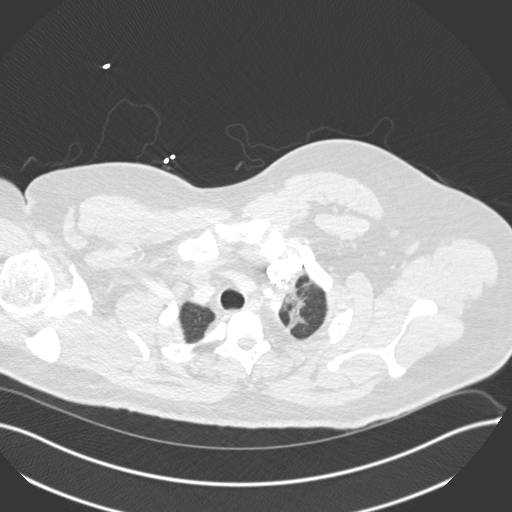
[im 299/313  soft-tissue]
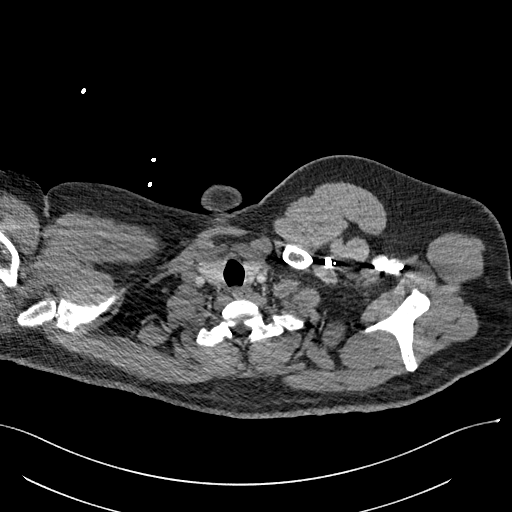

[Series 8: coronal mpr · coronal · 0.67mm/px · 3 of 181 slices shown]
[im 46/181  soft-tissue]
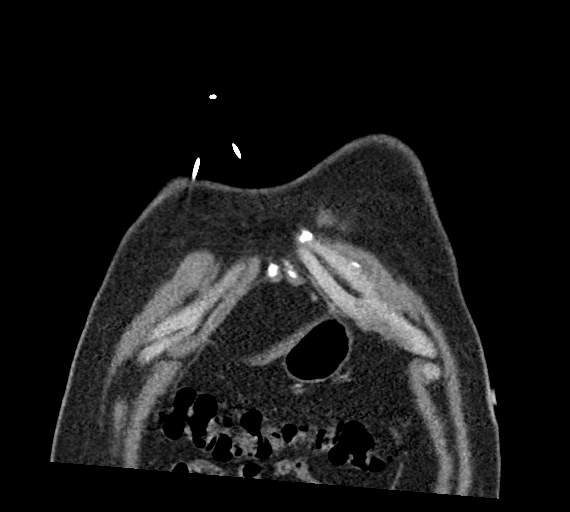
[im 91/181  soft-tissue]
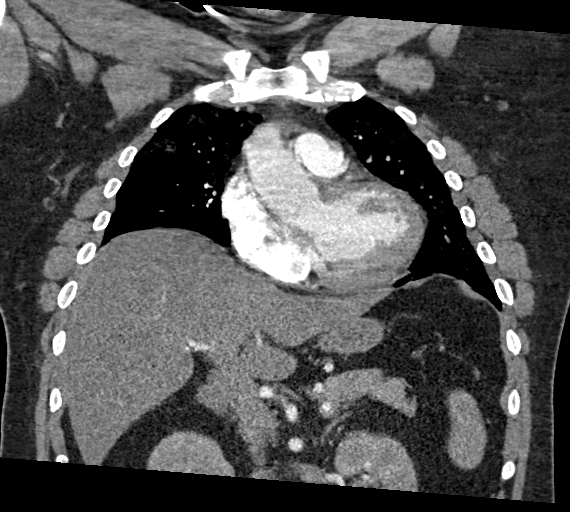
[im 136/181  soft-tissue]
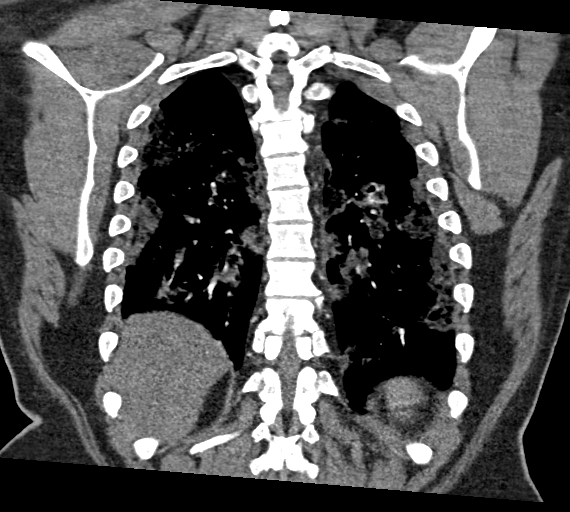

[19 of 46 positions shown; findings below may reference images not displayed]

FINDINGS: Cardiovascular: There is slightly suboptimal opacification of the
main pulmonary artery, however no central or proximal segmental
pulmonary embolism. The heart is normal in size. No pericardial
effusion or thickening. No evidence right heart strain. There is
normal three-vessel brachiocephalic anatomy without proximal
stenosis. The thoracic aorta is normal in appearance.

Mediastinum/Nodes: No hilar, mediastinal, or axillary adenopathy.
Thyroid gland, trachea, and esophagus demonstrate no significant
findings.

Lungs/Pleura: Extensive multifocal patchy airspace opacities are
seen throughout both lungs. No pneumothorax or pleural effusion.

Upper Abdomen: No acute abnormalities present in the visualized
portions of the upper abdomen.

Musculoskeletal: No chest wall abnormality. No acute or significant
osseous findings.

Review of the MIP images confirms the above findings.
IMPRESSION: Slightly suboptimal opacification of the main pulmonary artery,
however no central or proximal segmental pulmonary embolism.

Extensive multifocal airspace opacities, consistent with viral
multifocal pneumonia.

## 2021-02-23 NOTE — ED Notes (Signed)
Patient states he is going to check mychart in the morning for results
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
## 2021-08-10 ENCOUNTER — Encounter (HOSPITAL_BASED_OUTPATIENT_CLINIC_OR_DEPARTMENT_OTHER): Payer: Self-pay

## 2021-08-10 ENCOUNTER — Other Ambulatory Visit: Payer: Self-pay

## 2021-08-10 DIAGNOSIS — R519 Headache, unspecified: Secondary | ICD-10-CM | POA: Diagnosis present

## 2021-08-10 NOTE — ED Triage Notes (Signed)
Patient here POV from Home.  Endorses Migraine that began a few Days PTA. Worsened since, Especially today.   History of Migraines which is Treated by PCP. No Fevers. Moderate Nausea. Mild Emesis. No Diarrhea. Moderate Photosensitivity.   NAD Noted during Triage. A&Ox4. GCS 15. Ambulatory.

## 2021-08-11 ENCOUNTER — Encounter: Payer: Self-pay | Admitting: Neurology

## 2021-08-11 ENCOUNTER — Telehealth: Payer: Self-pay | Admitting: *Deleted

## 2021-08-11 ENCOUNTER — Ambulatory Visit (INDEPENDENT_AMBULATORY_CARE_PROVIDER_SITE_OTHER): Payer: Medicare Other | Admitting: Neurology

## 2021-08-11 ENCOUNTER — Emergency Department (HOSPITAL_BASED_OUTPATIENT_CLINIC_OR_DEPARTMENT_OTHER)
Admission: EM | Admit: 2021-08-11 | Discharge: 2021-08-11 | Disposition: A | Discharge status: Home / Self Care | Payer: Medicare Other | Attending: Emergency Medicine | Admitting: Emergency Medicine

## 2021-08-11 VITALS — BP 146/97 | HR 76 | Ht 67.0 in | Wt 252.4 lb

## 2021-08-11 DIAGNOSIS — G4733 Obstructive sleep apnea (adult) (pediatric): Secondary | ICD-10-CM

## 2021-08-11 DIAGNOSIS — R030 Elevated blood-pressure reading, without diagnosis of hypertension: Secondary | ICD-10-CM | POA: Diagnosis not present

## 2021-08-11 DIAGNOSIS — R519 Headache, unspecified: Secondary | ICD-10-CM | POA: Diagnosis not present

## 2021-08-11 DIAGNOSIS — I10 Essential (primary) hypertension: Secondary | ICD-10-CM

## 2021-08-11 DIAGNOSIS — G4440 Drug-induced headache, not elsewhere classified, not intractable: Secondary | ICD-10-CM

## 2021-08-11 DIAGNOSIS — Z789 Other specified health status: Secondary | ICD-10-CM

## 2021-08-11 MED ORDER — KETOROLAC TROMETHAMINE 30 MG/ML IJ SOLN
30.0000 mg | Freq: Once | INTRAMUSCULAR | Status: AC
  Administered 2021-08-11: 30 mg via INTRAVENOUS
  Filled 2021-08-11: qty 1

## 2021-08-11 MED ORDER — AMITRIPTYLINE HCL 25 MG PO TABS: 50.0000 mg | ORAL_TABLET | Freq: Every day | ORAL | 3 refills | Status: AC

## 2021-08-11 MED ORDER — METOCLOPRAMIDE HCL 5 MG/ML IJ SOLN
10.0000 mg | Freq: Once | INTRAMUSCULAR | Status: AC
  Administered 2021-08-11: 10 mg via INTRAVENOUS
  Filled 2021-08-11: qty 2

## 2021-08-11 MED ORDER — DIPHENHYDRAMINE HCL 50 MG/ML IJ SOLN
25.0000 mg | Freq: Once | INTRAMUSCULAR | Status: AC
Start: 2021-08-11 — End: 2021-08-11
  Administered 2021-08-11: 25 mg via INTRAVENOUS
  Filled 2021-08-11: qty 1

## 2021-08-11 MED ORDER — DEXAMETHASONE SODIUM PHOSPHATE 10 MG/ML IJ SOLN
10.0000 mg | Freq: Once | INTRAMUSCULAR | Status: AC
  Administered 2021-08-11: 10 mg via INTRAVENOUS
  Filled 2021-08-11: qty 1

## 2021-08-11 MED ORDER — SODIUM CHLORIDE 0.9 % IV BOLUS
1000.0000 mL | Freq: Once | INTRAVENOUS | Status: AC
  Administered 2021-08-11: 1000 mL via INTRAVENOUS

## 2021-08-11 NOTE — Telephone Encounter (Signed)
Cherylin Mylar, RN; Jari Favre Hello  [name]. Reason: He was non compliant and insurance did not pa. He will need to return his machine before we can proceed with another order.    What is needed to resolve: patient failed compliance and needs to return equipment or OOP  08/09/2021 11:13:58 AM    6x dialer attempts were made to reach the patient/relative, no new phone numbers were found, mailing final notice letter.    Thank You  Melanie      Previous Messages    ----- Message -----  From: [name], RN  Sent: 08/11/2021   8:52 AM EDT  To: [name]; Jari Favre   New order for pt.  In Epic.     David Stevenson  Male, 36 y.o., 1985/12/13  MRN:  [id number]  [facility] RN

## 2021-08-11 NOTE — ED Provider Notes (Signed)
[facility] EMERGENCY DEPT Provider Note   CSN: [id number] Arrival date & time: 08/10/21  2138     History  Chief Complaint  Patient presents with   Migraine    Michail Boyte is a 36 y.o. male.  Patient is a 36 year old male with past medical history of migraines and seasonal allergies.  Patient presenting today with complaints of headache.  He describes frontal headache that has worsened throughout the day.  He denies any visual disturbances, nausea, weakness, or numbness.  He reports taking Excedrin migraine with little relief.  He denies fevers, chills, or stiff neck.  Pain is worse with bright lights with no alleviating factors.  This feels similar to his previous migraines.  The history is provided by the patient.       Home Medications Prior to Admission medications   Medication Sig Start Date End Date Taking? Authorizing Provider  acetaminophen (TYLENOL) 500 MG tablet Take 1,000 mg by mouth every 6 (six) hours as needed for headache.    [provider]  amitriptyline (ELAVIL) 25 MG tablet Take 2 tablets (50 mg total) by mouth at bedtime. Follow titration instructions provided separately. 11/23/20   Huston Foley, MD  aspirin-acetaminophen-caffeine (EXCEDRIN EXTRA STRENGTH) [phone] MG tablet 2 tablets    [provider]  cetirizine (ZYRTEC) 10 MG tablet Take 1 tablet (10 mg total) by mouth daily. 11/29/20   Junie Spencer, FNP  fluticasone (FLONASE) 50 MCG/ACT nasal spray Place 2 sprays into both nostrils daily. 11/29/20   Junie Spencer, FNP  naproxen (NAPROSYN) 500 MG tablet Take 1 tablet (500 mg total) by mouth 2 (two) times daily with a meal. 11/29/20   Junie Spencer, FNP      Allergies    Topamax [topiramate]    Review of Systems   Review of Systems  All other systems reviewed and are negative.   Physical Exam Updated Vital Signs BP (!) 165/117   Pulse (!) 54   Temp 98 F (36.7 C)   Resp 18   Ht 5\' 8"  (1.727 m)    Wt 112.5 kg   SpO2 98%   BMI 37.71 kg/m  Physical Exam Vitals and nursing note reviewed.  Constitutional:      General: He is not in acute distress.    Appearance: He is well-developed. He is not diaphoretic.  HENT:     Head: Normocephalic and atraumatic.  Eyes:     Extraocular Movements: Extraocular movements intact.     Pupils: Pupils are equal, round, and reactive to light.  Cardiovascular:     Rate and Rhythm: Normal rate and regular rhythm.     Heart sounds: No murmur heard.    No friction rub.  Pulmonary:     Effort: Pulmonary effort is normal. No respiratory distress.     Breath sounds: Normal breath sounds. No wheezing or rales.  Abdominal:     General: Bowel sounds are normal. There is no distension.     Palpations: Abdomen is soft.     Tenderness: There is no abdominal tenderness.  Musculoskeletal:        General: Normal range of motion.     Cervical back: Normal range of motion and neck supple.  Skin:    General: Skin is warm and dry.  Neurological:     General: No focal deficit present.     Mental Status: He is alert and oriented to person, place, and time.     Cranial Nerves: No cranial nerve  deficit.     Motor: No weakness.     Coordination: Coordination normal.     ED Results / Procedures / Treatments   Labs (all labs ordered are listed, but only abnormal results are displayed) Labs Reviewed - No data to display  EKG None  Radiology No results found.  Procedures Procedures    Medications Ordered in ED Medications  sodium chloride 0.9 % bolus 1,000 mL (has no administration in time range)  ketorolac (TORADOL) 30 MG/ML injection 30 mg (has no administration in time range)  metoCLOPramide (REGLAN) injection 10 mg (has no administration in time range)  dexamethasone (DECADRON) injection 10 mg (has no administration in time range)  diphenhydrAMINE (BENADRYL) injection 25 mg (has no administration in time range)    ED Course/ Medical Decision  Making/ A&P  Patient with history of migraine headaches presenting with complaints of headache.  This is similar to his previous migraines and he is neurologically intact.  Patient given a migraine cocktail and is now sleeping soundly.  He will be discharged with as needed follow-up.  I see no indication for imaging studies or LP.  Final Clinical Impression(s) / ED Diagnoses Final diagnoses:  None    Rx / DC Orders ED Discharge Orders     None         Geoffery Lyons, MD 08/11/21 [phone]

## 2021-08-11 NOTE — Telephone Encounter (Signed)
Cherylin Mylar, RN; Jari Favre He will need to turn that machine in so we can start the process over due to him being non compliant      Previous Messages    ----- Message -----  From: [name], RN  Sent: 08/11/2021   7:52 AM EDT  To: [name]; Jari Favre   Good morning,   Have pt this am 0745, who as resvent machine.  He is not using,  had autopap then in 10/2020 was to change to CPAP,  I dont see in resassist.  Can you let me know asap as pt is here,  I'm aware you may not get this in time if you dont open till later.     Hilton Saephan  Male, 36 y.o., 1985-04-14  MRN:  [id number]  Delmer Islam

## 2021-08-11 NOTE — Discharge Instructions (Signed)
Continue your home medications as previously prescribed.  Follow-up with your primary doctor if you experience any additional problems.

## 2021-08-11 NOTE — Patient Instructions (Signed)
It was nice to see you again today.  I am sorry you are having a lot of trouble with recurrent headaches.  I do believe your headaches are from a combination of multiple factors including uncontrolled high blood pressure, untreated obstructive sleep apnea, medication overuse from taking Excedrin daily, caffeine use with excess (including soda and Excedrin). Here is what we talked about today:  1.  Restart amitriptyline for migraine prevention, 25 mg nightly for 1 week then 50 mg nightly thereafter. 2.  Taper off Excedrin completely. 3.  Try to trim beard as much as possible, maintain appointment with DME provider for a mask refit for his CPAP and restart CPAP therapy consistently.   4.  Continue to stay well-hydrated.   5.  Make an appointment for a full eye examination with any optometrist or ophthalmologist of your choosing, especially in light of recurrent headaches and uncontrolled hypertension, a full eye examination is warranted.   6.  We will do a brain MRI with and without contrast to rule out a structural cause of his recurrent headaches.   Please follow-up to see Amy in 3 months.

## 2021-08-11 NOTE — ED Notes (Signed)
Pt verbalizes understanding of discharge instructions. Opportunity for questioning and answers were provided. Pt discharged from ED to home with friend.    

## 2021-08-11 NOTE — Progress Notes (Signed)
Subjective:    Patient ID: David Stevenson is a 36 y.o. male.  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  The patient is unaccompanied today. He is re-referred by his primary care physician, Dr. Francesco Sor and at his office visit note from 05/28/21.  I last saw the patient on 09/17/2020, at which time he reported difficulty tolerating AutoPap therapy.  I suggested we reduce his pressure settings to 4 to 11 cm and increase his Topamax.  He was advised to proceed with a titration study.  In the interim, in October 2022 we switched him from Topamax to amitriptyline.  He had a CPAP titration study on 11/05/2020 which showed good results with CPAP of 14 cm via Evora (hybrid style) full facemask.  Based on the test results prescribed home CPAP therapy at a pressure of 14 cm. He did not return for follow-up since then.  Today, 08/11/2021: He reports frequent headaches.  He reports nocturnal headaches.  He has not taken the amitriptyline as prescribed, he takes 1 pill as needed when he feels a headache along.  We had talked about how preventative medications work before.  He has some medication left and is willing to restart it on a daily basis.  He does take Excedrin twice daily, 2 pills each time for total of 4 pills daily.  He reports having cut back on his soda intake since April 2023.  He drinks 1 20 ounce bottle per day.  He does not drink any alcohol, no illicit drug use, no other caffeine such as coffee or tea.  He denies any sudden onset of one-sided weakness  or numbness or tingling or droopy face or slurring of speech.  Of note, he presented to the emergency room on 08/10/2021 with a frontal headache.  I reviewed the emergency room records.  He was treated symptomatically with Benadryl, Reglan, Decadron and Toradol. Elevated at 165/117.  Of note, per primary care records he was been drinking quite a bit of soda, up to 2 L a day almost daily and takes daily Excedrin.  He reported nocturnal headaches.  A compliance download from his CPAP was not possible, he admits that he has not used his machine since March 2023.  He received a new mask about a week ago from his DME but has not gone in for a mask refit.  He has not trimmed his beard as we had discussed before.  This will allow him to have hopefully a better fit for a full facemask and open up more options for masks.  He is agreeable to going in for a mask refit appointment with his DME provider.    He had blood work through primary care on 05/25/2021.  I reviewed the results, CBC benign, lipid panel benign.  Chemistry panel showed BUN of 9, creatinine 0.8, ALT slightly elevated at 52.  I had advised him to taper off Topamax as he reported itching in October 2022.  We started amitriptyline for migraine prevention at the time.  His blood pressure was 138/80 on 05/28/2021.   Head CT without contrast on  11/18/2013 for indication of initial evaluation for acute headache, I reviewed the results: Impression: Normal head CT with no acute intracranial abnormality identified.  The patient's allergies, current medications, family history, past medical history, past social history, past surgical history and problem list were reviewed and updated as appropriate.    Previously:    I first met him at the request of his primary care physician on 06/16/2020, at which time he reported a prior diagnosis of migraine headaches.  He also had nocturnal headaches and concern for sleep apnea.  He was advised to proceed with headache  prevention in the form of Topamax.  He had been on it before but had stopped it.  He was advised to titrate to up to 100 mg daily.  In addition, he was advised to proceed with a sleep study.  He had a home sleep test on 06/29/2020 which indicated severe obstructive sleep apnea with an AHI of 44.1/h, O2 nadir 80%.  He was advised to start AutoPap therapy.  His set up date was 08/03/2020. He has a ResMed machine     06/16/20: (He) reports migraines since he was in elementary school.  He has previously been on different over-the-counter medications, most recently he has been taking Excedrin fairly frequently, after your visit he cut it back to taking 2 pills every other day.  He has nearly daily headaches, every other day it seems like a migraine with association of photophobia, sonophobia, nausea and sometimes vomiting.  It helps to lay down in a dark room, it has to be quiet.  He has no visual aura or visual symptoms such as blurry vision, double vision or loss of vision.  He has no neurological accompaniments such as one-sided weakness or numbness or tingling or droopy face or slurring of speech.  He had a brain MRI in the past as he recalls but not sure when it was done. A previous primary care physician tried him on Topamax 50 mg, he did not think it helped but did not go higher on the dose.  He also tried Maxalt recently through your office when he saw the nurse practitioner about a month ago and reports that it did not help.  He was supposed to have blood work done through your office but reports that he has not had any blood work.  In the past few months he has had nearly daily headaches.  He wakes up with a headache in the middle of the night or first thing in the morning.  His bedtime is late, around 1:30 AM or 2 AM and he may sleep till 1 PM.  He reports that his 71-year-old does not sleep well and wakes up around 11 PM and stays up for an hour or 2.  He lives with his wife and 4 children, ages 37, 71, 100 and  42-year-old.  He does not work, he is on disability.  He is a non-smoker and does not drink alcohol, he drinks caffeine, he has cut back after he spoke with you.  He drinks 1 cup of coffee in the morning and a 20 ounce soda maybe every other day at this time.   He has not seen an eye doctor in over 3 years.   I reviewed your telemedicine office note from 04/13/2020.  His Epworth sleepiness score is 12 out of 24. He had a head CT without contrast on 11/18/2013 and I reviewed the results:  IMPRESSION:  Normal head CT with no acute intracranial  abnormality identified.     In addition, I was able to review with the images through the PACS system.    His Past Medical History Is Significant For: Past Medical History:  Diagnosis Date   COVID-19    Migraine    Seizures ([place])    pt last seizure appx 14 years ago    His Past Surgical History Is Significant For: Past Surgical History:  Procedure Laterality Date   ABDOMINAL SURGERY      His Family History Is Significant For: Family History  Problem Relation Age of Onset   Seizures Father    Sleep apnea Maternal Aunt     His Social History Is Significant For: Social History   Socioeconomic History   Marital status: Significant Other    Spouse name: Not on file   Number of children: Not on file   Years of education: Not on file   Highest education level: Not on file  Occupational History   Not on file  Tobacco Use   Smoking status: Never   Smokeless tobacco: Never  Vaping Use   Vaping Use: Never used  Substance and Sexual Activity   Alcohol use: No   Drug use: No   Sexual activity: Yes  Other Topics Concern   Not on file  Social History Narrative   Caffeine 120 oz pepsi.     Social Determinants of Health   Financial Resource Strain: Not on file  Food Insecurity: Not on file  Transportation Needs: Not on file  Physical Activity: Not on file  Stress: Not on file  Social Connections: Not on file    His Allergies Are:   Allergies  Allergen Reactions   Topamax [Topiramate]     Itching, rash (localized forearm 25 cents size)  :   His Current Medications Are:  Outpatient Encounter Medications as of 08/11/2021  Medication Sig   acetaminophen (TYLENOL) 500 MG tablet Take 1,000 mg by mouth every 6 (six) hours as needed for headache.   aspirin-acetaminophen-caffeine (EXCEDRIN EXTRA STRENGTH) 250-250-65 MG tablet 2 tablets   amitriptyline (ELAVIL) 25 MG tablet Take 2 tablets (50 mg total) by mouth at bedtime. Follow titration instructions provided separately. (Patient not taking: Reported on 08/11/2021)   naproxen (NAPROSYN) 500 MG tablet Take 1 tablet (500 mg total) by mouth 2 (two) times daily with a meal. (Patient not taking: Reported on 08/11/2021)   [DISCONTINUED] cetirizine (ZYRTEC) 10 MG tablet Take 1 tablet (10 mg total) by mouth daily.   [DISCONTINUED] fluticasone (FLONASE) 50 MCG/ACT nasal spray Place 2 sprays into both nostrils daily.   No facility-administered encounter medications on file as of 08/11/2021.  :  Review of Systems:  Out of a complete 14 point review of systems, all are reviewed and negative with the exception of these symptoms as listed below:  Review of Systems  Neurological:        Pt here for headaches. Has not used cpap since 03/2021. Having issues with masks.  Got new mask and will use tonight.  DME [facility]. ESS 12. Was seen in ED last night and had migraine cocktail.  FSS 24.      Objective:  Neurological Exam  Physical Exam Physical Examination:   Vitals:   08/11/21 0730  BP: (!) 146/97  Pulse: 76    General Examination: The patient is a very pleasant 36 y.o. male in no acute distress. He appears well-developed and well-nourished and well groomed.  He does appear tired and somewhat sleepy.  He  had dozed off prior of my entering the room.  HEENT: Normocephalic, atraumatic, pupils are equal, round and reactive to light, extraocular tracking is good without limitation to  gaze excursion or nystagmus noted.  No significant photophobia, funduscopic exam is unremarkable.  Normal smooth pursuit is noted. Hearing is grossly intact. Face is symmetric with normal facial animation. Speech is clear with no dysarthria noted. There is no hypophonia. There is no lip, neck/head, jaw or voice tremor. Neck is supple with full range of passive and active motion. There are no carotid bruits on auscultation. Oropharynx exam reveals: mild to moderate mouth dryness, adequate dental hygiene and moderate airway crowding.  Tongue protrudes centrally and palate elevates symmetrically.   Chest: Clear to auscultation without wheezing, rhonchi or crackles noted.   Heart: S1+S2+0, regular and normal without murmurs, rubs or gallops noted.    Abdomen: Soft, non-tender and non-distended.   Extremities: There is no pitting edema in the distal lower extremities bilaterally.    Skin: Warm and dry without trophic changes noted.   Musculoskeletal: exam reveals no obvious joint deformities.    Neurologically:  Mental status: The patient is awake, alert and oriented in all 4 spheres. His immediate and remote memory, attention, language skills and fund of knowledge are appropriate. There is no evidence of aphasia, agnosia, apraxia or anomia. Speech is clear with normal prosody and enunciation. Thought process is linear. Mood is normal and affect is normal.  Cranial nerves II - XII are as described above under HEENT exam. Motor exam: Normal bulk, strength and tone is noted. There is no drift, tremor or rebound. Romberg is negative. Reflexes are 2+ throughout.  Toes are downgoing bilaterally.  Fine motor skills and coordination: intact with normal finger taps, normal hand movements, normal rapid alternating patting, normal foot taps and normal foot agility.  Cerebellar testing: No dysmetria or intention tremor. There is no truncal or gait ataxia.  Normal finger-to-nose and limited heel-to-shin secondary to  decreased range of motion but otherwise no dysmetria noted. Sensory exam: intact to light touch in the upper and lower extremities.  Gait, station and balance: He stands easily. No veering to one side is noted. No leaning to one side is noted. Posture is age-appropriate and stance is narrow based. Gait shows normal stride length and normal pace. No problems turning are noted. Tandem walk is unremarkable.    Assessment and Plan:    In summary, David Stevenson is a 36 year old male with an underlying medical history of learning difficulty (reading, per pt), headaches since elementary school, history of COVID in September 2021 with status post monoclonal antibody infusion treatment as well as prednisone treatment, history of seizures in childhood (he was born with epilepsy, he states, no Sz in many years), uncontrolled hypertension, severe sleep apnea, and obesity, who presents for reevaluation of of his recurrent headaches and severe sleep apnea.  His home sleep test from 06/29/2020 showed an AHI of 44.1/h, O2 nadir 80%.  He had trouble tolerating PAP therapy, even after we lowered the pressure settings.  He had a subsequent titration study on 11/18/2020, at which time his sleep apnea was under good control with CPAP of 14 cm.  I changed his machine settings in that regard. He has not been compliant with PAP therapy, indicates that he has not used his machine since March 2023.  He has recently presented to the emergency room for recurrent headaches.  We have changed his migraine preventative from Topamax to amitriptyline in October  2022.  He endorses nocturnal headaches, suspicious for headache secondary to untreated obstructive sleep apnea.  His headache syndrome is likely a mixed picture secondary to migrainous features, untreated obstructive sleep apnea, uncontrolled hypertension, medication overuse/rebound headaches from taking Excedrin nearly daily, as well as caffeine related rebound headaches.  I again  counseled him about the importance of treating his severe obstructive sleep apnea with CPAP therapy.  Alternative treatment options are very limited.  He is again advised to continue to strive for weight loss.  Today I suggested the following: 1.  Restart amitriptyline for migraine prevention, 25 mg nightly for 1 week then 50 mg nightly thereafter. 2.  Taper off Excedrin completely. 3.  Try to trim beard as much as possible, maintain appointment with DME provider for a mask refit for his CPAP and restart CPAP therapy consistently.   4.  Continue to stay well-hydrated.   5.  Keep appointment for an eye examination, especially in light of recurrent headaches and uncontrolled hypertension, a full eye examination is warranted.   6.  We will do a brain MRI with and without contrast to rule out a structural cause of his recurrent headaches.    He is advised to follow up to see Debbora Presto, NP in about 3 months, sooner if needed.  I answered all his questions today and he was in agreement with the plan. I spent 60 minutes in total face-to-face time and in reviewing records during pre-charting, more than 50% of which was spent in counseling and coordination of care, reviewing test results, reviewing medications and treatment regimen and/or in discussing or reviewing the diagnosis of mixed headaches, severe sleep apnea, medication overuse and rebound headaches, the prognosis and treatment options. Pertinent laboratory and imaging test results that were available during this visit with the patient were reviewed by me and considered in my medical decision making (see chart for details).

## 2021-08-12 ENCOUNTER — Telehealth: Payer: Self-pay | Admitting: Neurology

## 2021-08-12 NOTE — Telephone Encounter (Signed)
[employer] medicare/[place] med[employer] NPR sent to GI 

## 2021-08-16 NOTE — Telephone Encounter (Signed)
I called pt and relayed what [facility] said.  I gave him their # [phone] to contact and then he will contact us to move forward.  He appreciated call back.

## 2021-08-22 ENCOUNTER — Ambulatory Visit
Admission: RE | Admit: 2021-08-22 | Discharge: 2021-08-22 | Disposition: A | Discharge status: Home / Self Care | Payer: Medicare Other | Source: Ambulatory Visit | Attending: Neurology | Admitting: Neurology

## 2021-08-22 DIAGNOSIS — G4440 Drug-induced headache, not elsewhere classified, not intractable: Secondary | ICD-10-CM

## 2021-08-22 DIAGNOSIS — Z789 Other specified health status: Secondary | ICD-10-CM

## 2021-08-22 DIAGNOSIS — R519 Headache, unspecified: Secondary | ICD-10-CM

## 2021-08-22 DIAGNOSIS — I10 Essential (primary) hypertension: Secondary | ICD-10-CM

## 2021-08-22 DIAGNOSIS — G4733 Obstructive sleep apnea (adult) (pediatric): Secondary | ICD-10-CM

## 2021-08-22 DIAGNOSIS — R030 Elevated blood-pressure reading, without diagnosis of hypertension: Secondary | ICD-10-CM

## 2021-08-22 MED ORDER — GADOBENATE DIMEGLUMINE 529 MG/ML IV SOLN
20.0000 mL | Freq: Once | INTRAVENOUS | Status: AC | PRN
Start: 2021-08-22 — End: 2021-08-22
  Administered 2021-08-22: 20 mL via INTRAVENOUS

## 2021-08-23 ENCOUNTER — Telehealth: Payer: Self-pay

## 2021-08-23 NOTE — Telephone Encounter (Signed)
I called pt and relayed results of MRI. He verbalized understanding and appreciation for the call.

## 2021-08-23 NOTE — Telephone Encounter (Signed)
-----   Message from Huston Foley, MD sent at 08/23/2021 12:19 PM EDT ----- Brain MRI was reported as normal, please update patient.
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
## 2022-09-14 ENCOUNTER — Emergency Department (HOSPITAL_COMMUNITY)
Admission: EM | Admit: 2022-09-14 | Discharge: 2022-09-14 | Disposition: A | Discharge status: Home / Self Care | Payer: 59 | Attending: Emergency Medicine | Admitting: Emergency Medicine

## 2022-09-14 ENCOUNTER — Encounter (HOSPITAL_COMMUNITY): Payer: Self-pay

## 2022-09-14 ENCOUNTER — Other Ambulatory Visit: Payer: Self-pay

## 2022-09-14 ENCOUNTER — Emergency Department (HOSPITAL_COMMUNITY): Payer: 59

## 2022-09-14 DIAGNOSIS — M25511 Pain in right shoulder: Secondary | ICD-10-CM | POA: Insufficient documentation

## 2022-09-14 DIAGNOSIS — R202 Paresthesia of skin: Secondary | ICD-10-CM | POA: Diagnosis not present

## 2022-09-14 DIAGNOSIS — Z5321 Procedure and treatment not carried out due to patient leaving prior to being seen by health care provider: Secondary | ICD-10-CM | POA: Insufficient documentation

## 2022-09-14 NOTE — ED Notes (Signed)
Pt in xray
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
## 2022-09-14 NOTE — ED Provider Notes (Signed)
Patient eloped and was not evaluated by a provider.   Achille Rich, PA-C 09/14/22 1308    Tegeler, Canary Brim, MD 09/14/22 [phone]
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
